# Patient Record
Sex: Female | Born: 1955 | Race: White | Hispanic: No | Marital: Married | Smoking: Never smoker
Health system: Southern US, Community
[De-identification: ages and names within clinical notes are randomized; demographics above are authoritative.]

## PROBLEM LIST (undated history)

## (undated) DIAGNOSIS — O039 Complete or unspecified spontaneous abortion without complication: Secondary | ICD-10-CM

## (undated) DIAGNOSIS — Z87442 Personal history of urinary calculi: Secondary | ICD-10-CM

## (undated) DIAGNOSIS — I493 Ventricular premature depolarization: Secondary | ICD-10-CM

## (undated) HISTORY — PX: WISDOM TOOTH EXTRACTION: SHX21

## (undated) HISTORY — PX: MYOMECTOMY: SHX85

## (undated) HISTORY — PX: OTHER SURGICAL HISTORY: SHX169

## (undated) HISTORY — PX: DILATION AND CURETTAGE OF UTERUS: SHX78

---

## 1998-08-15 ENCOUNTER — Other Ambulatory Visit: Admission: RE | Admit: 1998-08-15 | Discharge: 1998-08-15 | Payer: Self-pay | Admitting: Obstetrics and Gynecology

## 1998-11-07 ENCOUNTER — Ambulatory Visit (HOSPITAL_COMMUNITY): Admission: RE | Admit: 1998-11-07 | Discharge: 1998-11-07 | Payer: Self-pay | Admitting: Obstetrics and Gynecology

## 1999-08-07 ENCOUNTER — Encounter: Payer: Self-pay | Admitting: Obstetrics and Gynecology

## 1999-08-07 ENCOUNTER — Encounter: Admission: RE | Admit: 1999-08-07 | Discharge: 1999-08-07 | Payer: Self-pay | Admitting: Obstetrics and Gynecology

## 1999-08-08 ENCOUNTER — Other Ambulatory Visit: Admission: RE | Admit: 1999-08-08 | Discharge: 1999-08-08 | Payer: Self-pay | Admitting: Obstetrics and Gynecology

## 1999-08-21 ENCOUNTER — Encounter: Admission: RE | Admit: 1999-08-21 | Discharge: 1999-11-19 | Payer: Self-pay | Admitting: Family Medicine

## 1999-11-24 ENCOUNTER — Ambulatory Visit (HOSPITAL_COMMUNITY): Admission: RE | Admit: 1999-11-24 | Discharge: 1999-11-24 | Payer: Self-pay | Admitting: Obstetrics and Gynecology

## 1999-11-24 ENCOUNTER — Encounter (INDEPENDENT_AMBULATORY_CARE_PROVIDER_SITE_OTHER): Payer: Self-pay

## 2000-08-06 ENCOUNTER — Other Ambulatory Visit: Admission: RE | Admit: 2000-08-06 | Discharge: 2000-08-06 | Payer: Self-pay | Admitting: Obstetrics and Gynecology

## 2000-08-08 ENCOUNTER — Encounter: Admission: RE | Admit: 2000-08-08 | Discharge: 2000-08-08 | Payer: Self-pay | Admitting: Obstetrics and Gynecology

## 2000-08-08 ENCOUNTER — Encounter: Payer: Self-pay | Admitting: Obstetrics and Gynecology

## 2000-12-02 ENCOUNTER — Encounter: Payer: Self-pay | Admitting: Family Medicine

## 2000-12-02 ENCOUNTER — Encounter: Admission: RE | Admit: 2000-12-02 | Discharge: 2000-12-02 | Payer: Self-pay | Admitting: Family Medicine

## 2001-03-11 ENCOUNTER — Encounter: Admission: RE | Admit: 2001-03-11 | Discharge: 2001-03-11 | Payer: Self-pay | Admitting: Obstetrics and Gynecology

## 2001-03-11 ENCOUNTER — Encounter: Payer: Self-pay | Admitting: Obstetrics and Gynecology

## 2001-08-11 ENCOUNTER — Encounter: Payer: Self-pay | Admitting: Obstetrics and Gynecology

## 2001-08-11 ENCOUNTER — Other Ambulatory Visit: Admission: RE | Admit: 2001-08-11 | Discharge: 2001-08-11 | Payer: Self-pay | Admitting: Obstetrics and Gynecology

## 2001-08-11 ENCOUNTER — Encounter: Admission: RE | Admit: 2001-08-11 | Discharge: 2001-08-11 | Payer: Self-pay | Admitting: Obstetrics and Gynecology

## 2002-08-13 ENCOUNTER — Encounter: Payer: Self-pay | Admitting: Obstetrics and Gynecology

## 2002-08-13 ENCOUNTER — Encounter: Admission: RE | Admit: 2002-08-13 | Discharge: 2002-08-13 | Payer: Self-pay | Admitting: Obstetrics and Gynecology

## 2002-08-24 ENCOUNTER — Other Ambulatory Visit: Admission: RE | Admit: 2002-08-24 | Discharge: 2002-08-24 | Payer: Self-pay | Admitting: Obstetrics and Gynecology

## 2003-08-16 ENCOUNTER — Encounter: Admission: RE | Admit: 2003-08-16 | Discharge: 2003-08-16 | Payer: Self-pay | Admitting: Obstetrics and Gynecology

## 2003-08-30 ENCOUNTER — Other Ambulatory Visit: Admission: RE | Admit: 2003-08-30 | Discharge: 2003-08-30 | Payer: Self-pay | Admitting: Obstetrics and Gynecology

## 2004-07-03 ENCOUNTER — Encounter: Admission: RE | Admit: 2004-07-03 | Discharge: 2004-07-03 | Payer: Self-pay | Admitting: Obstetrics and Gynecology

## 2005-06-04 ENCOUNTER — Encounter: Admission: RE | Admit: 2005-06-04 | Discharge: 2005-06-04 | Payer: Self-pay | Admitting: Family Medicine

## 2005-07-18 ENCOUNTER — Encounter: Admission: RE | Admit: 2005-07-18 | Discharge: 2005-07-18 | Payer: Self-pay | Admitting: Obstetrics and Gynecology

## 2006-07-29 ENCOUNTER — Encounter: Admission: RE | Admit: 2006-07-29 | Discharge: 2006-07-29 | Payer: Self-pay | Admitting: Family Medicine

## 2006-09-05 ENCOUNTER — Other Ambulatory Visit: Admission: RE | Admit: 2006-09-05 | Discharge: 2006-09-05 | Payer: Self-pay | Admitting: Obstetrics and Gynecology

## 2007-08-04 ENCOUNTER — Encounter: Admission: RE | Admit: 2007-08-04 | Discharge: 2007-08-04 | Payer: Self-pay | Admitting: Obstetrics and Gynecology

## 2007-08-05 ENCOUNTER — Encounter: Admission: RE | Admit: 2007-08-05 | Discharge: 2007-08-05 | Payer: Self-pay | Admitting: Obstetrics and Gynecology

## 2007-09-08 ENCOUNTER — Other Ambulatory Visit: Admission: RE | Admit: 2007-09-08 | Discharge: 2007-09-22 | Payer: Self-pay | Admitting: Obstetrics and Gynecology

## 2008-07-28 ENCOUNTER — Encounter: Admission: RE | Admit: 2008-07-28 | Discharge: 2008-07-28 | Payer: Self-pay | Admitting: Obstetrics and Gynecology

## 2009-08-03 ENCOUNTER — Encounter: Admission: RE | Admit: 2009-08-03 | Discharge: 2009-08-03 | Payer: Self-pay | Admitting: Family Medicine

## 2009-08-30 ENCOUNTER — Other Ambulatory Visit: Admission: RE | Admit: 2009-08-30 | Discharge: 2009-08-30 | Payer: Self-pay | Admitting: Obstetrics and Gynecology

## 2010-07-08 ENCOUNTER — Other Ambulatory Visit: Payer: Self-pay | Admitting: Family Medicine

## 2010-07-08 DIAGNOSIS — Z1239 Encounter for other screening for malignant neoplasm of breast: Secondary | ICD-10-CM

## 2010-08-08 ENCOUNTER — Other Ambulatory Visit: Payer: Self-pay | Admitting: Obstetrics and Gynecology

## 2010-08-08 ENCOUNTER — Ambulatory Visit
Admission: RE | Admit: 2010-08-08 | Discharge: 2010-08-08 | Disposition: A | Discharge status: Home / Self Care | Payer: BC Managed Care – PPO | Source: Ambulatory Visit | Attending: Family Medicine | Admitting: Family Medicine

## 2010-08-08 ENCOUNTER — Other Ambulatory Visit (HOSPITAL_COMMUNITY)
Admission: RE | Admit: 2010-08-08 | Discharge: 2010-08-08 | Disposition: A | Discharge status: Home / Self Care | Payer: BC Managed Care – PPO | Source: Ambulatory Visit | Attending: Obstetrics & Gynecology | Admitting: Obstetrics & Gynecology

## 2010-08-08 DIAGNOSIS — Z01419 Encounter for gynecological examination (general) (routine) without abnormal findings: Secondary | ICD-10-CM | POA: Insufficient documentation

## 2010-08-08 DIAGNOSIS — Z1239 Encounter for other screening for malignant neoplasm of breast: Secondary | ICD-10-CM

## 2010-11-03 NOTE — Op Note (Signed)
[facility]  [name], Sarah Schultz                          MRN: [id number] Proc. Date: 11/24/99 Adm. Date:  [id number] Disc. Date: [id number] Attending:  Morene Antu                           Operative Report  PREOPERATIVE DIAGNOSIS:       Fibroid uterus, menorrhagia.  POSTOPERATIVE DIAGNOSIS:      Submucosal fibroid.  PROCEDURE:                    D&C hysteroscopy with resectoscope.  SURGEON:                      Sherry A. Rosalio Macadamia, M.D.  ANESTHESIA:                   MAC.  INDICATIONS:                  This is a 55 year old G1 P0-0-1-0 woman who has a known fibroid uterus.  Patient has had a previous D&C hysteroscopy for resection of fibroid uterus and fibroids in May 2000.  Patient initially had decreased bleeding.  However, the bleeding has increased significantly over the last few months again.  Because of the increased bleeding, the patient was seen in the office and an ultrasound was performed.  Ultrasound revealed multiple fibroids with a possibility of a repeat submucosal fibroid.  Because of this, a D&C hysteroscopy with resectoscope was performed.  FINDINGS:                     Normal size anteflexed uterus, slightly irregular, no adnexal mass.  Submucosal fibroid present approximately 1-2 cm.  PROCEDURE:                    Patient is brought into the operating room and given adequate IV sedation.  She is placed in the dorsolithotomy position. Her perineum was washed with Betadine.  Pelvic examination was performed.  The surgeons gown and gloves were changed.  Patient was draped in a sterile fashion.  Speculum was placed within the vagina.  The vagina was washed with Betadine.  A paracervical block was administered with 1% Nesacaine.  Anterior lip of the cervix was grasped with a single tooth tenaculum.  Cervix was sounded.  Cervix was dilated with Pratt dilators to a #31.  The hysteroscope was easily introduced into the  endometrial cavity and pictures were obtained. A slight depression of the endometrial cavity was seen just to the right of the submucosal fibroid.  This was carefully inspected and felt not to be a perforation.  Both cornua were visualized and pictures were obtained.  Using a double-loop right-angle resector, the submucosal fibroid was resected off in sheets.  Once this was felt to be completed, the endometrial cavity was revisualized.  The ______ differential had been monitored very carefully during the procedure.  ______ differential just prior to the last removal of the fibroid was -90, and immediately after was -160.  Evaluation revealed that there was a possibility of a small perforation.  Because of this, the procedure was terminated at this time.  The final differential was measured at -120, having collected the fluid beneath the patients buttocks, as well as the extra fluid  in the bag that had not been measured prior to this time.  The speculum was replaced in the vagina after removing the tenaculum.  There was adequate hemostasis.  There was no vaginal bleeding present and no fluid coming from the cervix.  The patient was then taken out of the dorsolithotomy position.  She was awakened.  She was moved from the operating table to a stretcher in stable condition.  COMPLICATIONS:                Question of a uterine perforation.  CONDITION:                    Stable.  All information was given to the patient immediately postoperatively.  ESTIMATED BLOOD LOSS:         Less than 5 cc.   ______ differential -120. DD:  11/24/99 TD:  11/27/99 Job: [id number]

## 2011-07-02 ENCOUNTER — Other Ambulatory Visit: Payer: Self-pay | Admitting: Family Medicine

## 2011-07-02 DIAGNOSIS — Z1231 Encounter for screening mammogram for malignant neoplasm of breast: Secondary | ICD-10-CM

## 2011-08-10 ENCOUNTER — Ambulatory Visit
Admission: RE | Admit: 2011-08-10 | Discharge: 2011-08-10 | Disposition: A | Discharge status: Home / Self Care | Payer: BC Managed Care – PPO | Source: Ambulatory Visit | Attending: Family Medicine | Admitting: Family Medicine

## 2011-08-10 DIAGNOSIS — Z1231 Encounter for screening mammogram for malignant neoplasm of breast: Secondary | ICD-10-CM

## 2011-08-17 ENCOUNTER — Other Ambulatory Visit (HOSPITAL_COMMUNITY)
Admission: RE | Admit: 2011-08-17 | Discharge: 2011-08-17 | Disposition: A | Discharge status: Home / Self Care | Payer: BC Managed Care – PPO | Source: Ambulatory Visit | Attending: Obstetrics and Gynecology | Admitting: Obstetrics and Gynecology

## 2011-08-17 ENCOUNTER — Other Ambulatory Visit: Payer: Self-pay | Admitting: Obstetrics and Gynecology

## 2011-08-17 DIAGNOSIS — Z01419 Encounter for gynecological examination (general) (routine) without abnormal findings: Secondary | ICD-10-CM | POA: Insufficient documentation

## 2012-07-17 ENCOUNTER — Other Ambulatory Visit: Payer: Self-pay | Admitting: Family Medicine

## 2012-07-17 DIAGNOSIS — Z1231 Encounter for screening mammogram for malignant neoplasm of breast: Secondary | ICD-10-CM

## 2012-08-11 ENCOUNTER — Ambulatory Visit
Admission: RE | Admit: 2012-08-11 | Discharge: 2012-08-11 | Disposition: A | Discharge status: Home / Self Care | Payer: BC Managed Care – PPO | Source: Ambulatory Visit | Attending: Family Medicine | Admitting: Family Medicine

## 2012-08-11 DIAGNOSIS — Z1231 Encounter for screening mammogram for malignant neoplasm of breast: Secondary | ICD-10-CM

## 2013-07-10 ENCOUNTER — Other Ambulatory Visit: Payer: Self-pay

## 2013-07-10 DIAGNOSIS — Z1231 Encounter for screening mammogram for malignant neoplasm of breast: Secondary | ICD-10-CM

## 2013-08-18 ENCOUNTER — Ambulatory Visit
Admission: RE | Admit: 2013-08-18 | Discharge: 2013-08-18 | Disposition: A | Discharge status: Home / Self Care | Payer: Self-pay | Source: Ambulatory Visit

## 2013-08-18 DIAGNOSIS — Z1231 Encounter for screening mammogram for malignant neoplasm of breast: Secondary | ICD-10-CM

## 2014-07-29 ENCOUNTER — Other Ambulatory Visit: Payer: Self-pay

## 2014-07-29 DIAGNOSIS — Z1231 Encounter for screening mammogram for malignant neoplasm of breast: Secondary | ICD-10-CM

## 2014-08-23 ENCOUNTER — Ambulatory Visit
Admission: RE | Admit: 2014-08-23 | Discharge: 2014-08-23 | Disposition: A | Discharge status: Home / Self Care | Payer: BLUE CROSS/BLUE SHIELD | Source: Ambulatory Visit

## 2014-08-23 DIAGNOSIS — Z1231 Encounter for screening mammogram for malignant neoplasm of breast: Secondary | ICD-10-CM

## 2015-07-22 ENCOUNTER — Other Ambulatory Visit: Payer: Self-pay

## 2015-07-22 DIAGNOSIS — Z1231 Encounter for screening mammogram for malignant neoplasm of breast: Secondary | ICD-10-CM

## 2015-08-26 ENCOUNTER — Ambulatory Visit
Admission: RE | Admit: 2015-08-26 | Discharge: 2015-08-26 | Disposition: A | Discharge status: Home / Self Care | Payer: BLUE CROSS/BLUE SHIELD | Source: Ambulatory Visit

## 2015-08-26 DIAGNOSIS — Z1231 Encounter for screening mammogram for malignant neoplasm of breast: Secondary | ICD-10-CM

## 2015-10-28 DIAGNOSIS — J069 Acute upper respiratory infection, unspecified: Secondary | ICD-10-CM | POA: Diagnosis not present

## 2016-02-21 DIAGNOSIS — Z23 Encounter for immunization: Secondary | ICD-10-CM | POA: Diagnosis not present

## 2016-05-30 DIAGNOSIS — R002 Palpitations: Secondary | ICD-10-CM | POA: Diagnosis not present

## 2016-05-30 DIAGNOSIS — E7800 Pure hypercholesterolemia, unspecified: Secondary | ICD-10-CM | POA: Diagnosis not present

## 2016-05-30 DIAGNOSIS — Z0000 Encounter for general adult medical examination without abnormal findings: Secondary | ICD-10-CM | POA: Diagnosis not present

## 2016-05-30 DIAGNOSIS — N951 Menopausal and female climacteric states: Secondary | ICD-10-CM | POA: Diagnosis not present

## 2016-06-05 DIAGNOSIS — R52 Pain, unspecified: Secondary | ICD-10-CM | POA: Diagnosis not present

## 2016-06-05 DIAGNOSIS — R05 Cough: Secondary | ICD-10-CM | POA: Diagnosis not present

## 2016-06-05 DIAGNOSIS — J22 Unspecified acute lower respiratory infection: Secondary | ICD-10-CM | POA: Diagnosis not present

## 2016-06-20 DIAGNOSIS — H5213 Myopia, bilateral: Secondary | ICD-10-CM | POA: Diagnosis not present

## 2016-06-20 DIAGNOSIS — H25813 Combined forms of age-related cataract, bilateral: Secondary | ICD-10-CM | POA: Diagnosis not present

## 2016-07-16 ENCOUNTER — Other Ambulatory Visit: Payer: Self-pay | Admitting: Family Medicine

## 2016-07-16 DIAGNOSIS — Z1231 Encounter for screening mammogram for malignant neoplasm of breast: Secondary | ICD-10-CM

## 2016-08-27 ENCOUNTER — Ambulatory Visit
Admission: RE | Admit: 2016-08-27 | Discharge: 2016-08-27 | Disposition: A | Discharge status: Home / Self Care | Payer: BLUE CROSS/BLUE SHIELD | Source: Ambulatory Visit | Attending: Family Medicine | Admitting: Family Medicine

## 2016-08-27 DIAGNOSIS — Z1231 Encounter for screening mammogram for malignant neoplasm of breast: Secondary | ICD-10-CM | POA: Diagnosis not present

## 2016-09-18 DIAGNOSIS — Z01419 Encounter for gynecological examination (general) (routine) without abnormal findings: Secondary | ICD-10-CM | POA: Diagnosis not present

## 2016-09-18 DIAGNOSIS — Z6829 Body mass index (BMI) 29.0-29.9, adult: Secondary | ICD-10-CM | POA: Diagnosis not present

## 2016-12-05 DIAGNOSIS — I493 Ventricular premature depolarization: Secondary | ICD-10-CM | POA: Diagnosis not present

## 2016-12-05 DIAGNOSIS — L259 Unspecified contact dermatitis, unspecified cause: Secondary | ICD-10-CM | POA: Diagnosis not present

## 2016-12-05 DIAGNOSIS — E7800 Pure hypercholesterolemia, unspecified: Secondary | ICD-10-CM | POA: Diagnosis not present

## 2017-07-01 DIAGNOSIS — Z0000 Encounter for general adult medical examination without abnormal findings: Secondary | ICD-10-CM | POA: Diagnosis not present

## 2017-07-01 DIAGNOSIS — E7800 Pure hypercholesterolemia, unspecified: Secondary | ICD-10-CM | POA: Diagnosis not present

## 2017-07-04 DIAGNOSIS — M25519 Pain in unspecified shoulder: Secondary | ICD-10-CM | POA: Diagnosis not present

## 2017-07-08 DIAGNOSIS — M25519 Pain in unspecified shoulder: Secondary | ICD-10-CM | POA: Diagnosis not present

## 2017-07-11 DIAGNOSIS — M25519 Pain in unspecified shoulder: Secondary | ICD-10-CM | POA: Diagnosis not present

## 2017-07-15 ENCOUNTER — Other Ambulatory Visit: Payer: Self-pay | Admitting: Family Medicine

## 2017-07-15 DIAGNOSIS — M25519 Pain in unspecified shoulder: Secondary | ICD-10-CM | POA: Diagnosis not present

## 2017-07-15 DIAGNOSIS — Z139 Encounter for screening, unspecified: Secondary | ICD-10-CM

## 2017-07-18 DIAGNOSIS — M25519 Pain in unspecified shoulder: Secondary | ICD-10-CM | POA: Diagnosis not present

## 2017-07-18 DIAGNOSIS — Z1211 Encounter for screening for malignant neoplasm of colon: Secondary | ICD-10-CM | POA: Diagnosis not present

## 2017-07-22 DIAGNOSIS — M25519 Pain in unspecified shoulder: Secondary | ICD-10-CM | POA: Diagnosis not present

## 2017-07-25 DIAGNOSIS — M25519 Pain in unspecified shoulder: Secondary | ICD-10-CM | POA: Diagnosis not present

## 2017-07-29 DIAGNOSIS — M25519 Pain in unspecified shoulder: Secondary | ICD-10-CM | POA: Diagnosis not present

## 2017-08-01 DIAGNOSIS — M25519 Pain in unspecified shoulder: Secondary | ICD-10-CM | POA: Diagnosis not present

## 2017-08-05 DIAGNOSIS — H04122 Dry eye syndrome of left lacrimal gland: Secondary | ICD-10-CM | POA: Diagnosis not present

## 2017-08-05 DIAGNOSIS — M25519 Pain in unspecified shoulder: Secondary | ICD-10-CM | POA: Diagnosis not present

## 2017-08-08 DIAGNOSIS — M25519 Pain in unspecified shoulder: Secondary | ICD-10-CM | POA: Diagnosis not present

## 2017-08-12 DIAGNOSIS — M25519 Pain in unspecified shoulder: Secondary | ICD-10-CM | POA: Diagnosis not present

## 2017-08-14 DIAGNOSIS — M25519 Pain in unspecified shoulder: Secondary | ICD-10-CM | POA: Diagnosis not present

## 2017-08-19 DIAGNOSIS — M25519 Pain in unspecified shoulder: Secondary | ICD-10-CM | POA: Diagnosis not present

## 2017-08-26 DIAGNOSIS — M25519 Pain in unspecified shoulder: Secondary | ICD-10-CM | POA: Diagnosis not present

## 2017-08-28 ENCOUNTER — Encounter: Payer: Self-pay | Admitting: Radiology

## 2017-08-28 ENCOUNTER — Ambulatory Visit
Admission: RE | Admit: 2017-08-28 | Discharge: 2017-08-28 | Disposition: A | Discharge status: Home / Self Care | Payer: BLUE CROSS/BLUE SHIELD | Source: Ambulatory Visit | Attending: Family Medicine | Admitting: Family Medicine

## 2017-08-28 DIAGNOSIS — Z1231 Encounter for screening mammogram for malignant neoplasm of breast: Secondary | ICD-10-CM | POA: Diagnosis not present

## 2017-08-28 DIAGNOSIS — Z139 Encounter for screening, unspecified: Secondary | ICD-10-CM

## 2017-09-02 DIAGNOSIS — M25519 Pain in unspecified shoulder: Secondary | ICD-10-CM | POA: Diagnosis not present

## 2017-09-11 DIAGNOSIS — D125 Benign neoplasm of sigmoid colon: Secondary | ICD-10-CM | POA: Diagnosis not present

## 2017-09-11 DIAGNOSIS — Z1211 Encounter for screening for malignant neoplasm of colon: Secondary | ICD-10-CM | POA: Diagnosis not present

## 2017-09-11 DIAGNOSIS — K635 Polyp of colon: Secondary | ICD-10-CM | POA: Diagnosis not present

## 2017-09-11 DIAGNOSIS — D122 Benign neoplasm of ascending colon: Secondary | ICD-10-CM | POA: Diagnosis not present

## 2017-09-20 DIAGNOSIS — Z6829 Body mass index (BMI) 29.0-29.9, adult: Secondary | ICD-10-CM | POA: Diagnosis not present

## 2017-09-20 DIAGNOSIS — Z1151 Encounter for screening for human papillomavirus (HPV): Secondary | ICD-10-CM | POA: Diagnosis not present

## 2017-09-20 DIAGNOSIS — Z01419 Encounter for gynecological examination (general) (routine) without abnormal findings: Secondary | ICD-10-CM | POA: Diagnosis not present

## 2017-12-09 DIAGNOSIS — E7800 Pure hypercholesterolemia, unspecified: Secondary | ICD-10-CM | POA: Diagnosis not present

## 2018-06-03 ENCOUNTER — Emergency Department (HOSPITAL_COMMUNITY)
Admission: EM | Admit: 2018-06-03 | Discharge: 2018-06-03 | Disposition: A | Discharge status: Home / Self Care | Payer: BLUE CROSS/BLUE SHIELD | Attending: Emergency Medicine | Admitting: Emergency Medicine

## 2018-06-03 ENCOUNTER — Emergency Department (HOSPITAL_COMMUNITY): Payer: BLUE CROSS/BLUE SHIELD

## 2018-06-03 ENCOUNTER — Other Ambulatory Visit: Payer: Self-pay

## 2018-06-03 ENCOUNTER — Encounter (HOSPITAL_COMMUNITY): Payer: Self-pay | Admitting: Emergency Medicine

## 2018-06-03 DIAGNOSIS — N200 Calculus of kidney: Secondary | ICD-10-CM | POA: Insufficient documentation

## 2018-06-03 DIAGNOSIS — R1031 Right lower quadrant pain: Secondary | ICD-10-CM | POA: Diagnosis not present

## 2018-06-03 DIAGNOSIS — N132 Hydronephrosis with renal and ureteral calculous obstruction: Secondary | ICD-10-CM | POA: Diagnosis not present

## 2018-06-03 DIAGNOSIS — Z79899 Other long term (current) drug therapy: Secondary | ICD-10-CM | POA: Insufficient documentation

## 2018-06-03 DIAGNOSIS — Z7982 Long term (current) use of aspirin: Secondary | ICD-10-CM | POA: Insufficient documentation

## 2018-06-03 LAB — URINALYSIS, ROUTINE W REFLEX MICROSCOPIC
BILIRUBIN URINE: NEGATIVE
Glucose, UA: NEGATIVE mg/dL
Ketones, ur: 20 mg/dL — AB
Leukocytes, UA: NEGATIVE
Nitrite: NEGATIVE
Protein, ur: NEGATIVE mg/dL
Specific Gravity, Urine: 1.004 — ABNORMAL LOW (ref 1.005–1.030)
pH: 6 (ref 5.0–8.0)

## 2018-06-03 LAB — COMPREHENSIVE METABOLIC PANEL
ALT: 37 U/L (ref 0–44)
AST: 38 U/L (ref 15–41)
Albumin: 4.4 g/dL (ref 3.5–5.0)
Alkaline Phosphatase: 67 U/L (ref 38–126)
Anion gap: 9 (ref 5–15)
BUN: 11 mg/dL (ref 6–20)
CALCIUM: 9.5 mg/dL (ref 8.9–10.3)
CO2: 25 mmol/L (ref 22–32)
Chloride: 107 mmol/L (ref 98–111)
Creatinine, Ser: 0.9 mg/dL (ref 0.44–1.00)
GFR calc Af Amer: >60 mL/min (ref >60)
GFR calc non Af Amer: >60 mL/min (ref >60)
Glucose, Bld: 106 mg/dL — ABNORMAL HIGH (ref 70–99)
Potassium: 4.2 mmol/L (ref 3.5–5.1)
Sodium: 141 mmol/L (ref 135–145)
Total Bilirubin: 0.6 mg/dL (ref 0.3–1.2)
Total Protein: 7.5 g/dL (ref 6.5–8.1)

## 2018-06-03 LAB — CBC
HCT: 43.7 % (ref 36.0–46.0)
Hemoglobin: 14.3 g/dL (ref 12.0–15.0)
MCH: 30.8 pg (ref 26.0–34.0)
MCHC: 32.7 g/dL (ref 30.0–36.0)
MCV: 94.2 fL (ref 80.0–100.0)
PLATELETS: 306 10*3/uL (ref 150–400)
RBC: 4.64 MIL/uL (ref 3.87–5.11)
RDW: 12.6 % (ref 11.5–15.5)
WBC: 7.7 10*3/uL (ref 4.0–10.5)
nRBC: 0 % (ref 0.0–0.2)

## 2018-06-03 LAB — LIPASE, BLOOD: Lipase: 25 U/L (ref 11–51)

## 2018-06-03 MED ORDER — TAMSULOSIN HCL 0.4 MG PO CAPS: 0.4000 mg | ORAL_CAPSULE | Freq: Every day | ORAL | 0 refills | Status: DC

## 2018-06-03 MED ORDER — IOPAMIDOL (ISOVUE-300) INJECTION 61%
100.0000 mL | Freq: Once | INTRAVENOUS | Status: AC | PRN
  Administered 2018-06-03: 100 mL via INTRAVENOUS

## 2018-06-03 MED ORDER — SODIUM CHLORIDE (PF) 0.9 % IJ SOLN
INTRAMUSCULAR | Status: AC
  Filled 2018-06-03: qty 50

## 2018-06-03 MED ORDER — ONDANSETRON 4 MG PO TBDP: 4.0000 mg | ORAL_TABLET | Freq: Three times a day (TID) | ORAL | 0 refills | Status: DC | PRN

## 2018-06-03 MED ORDER — IOPAMIDOL (ISOVUE-300) INJECTION 61%
INTRAVENOUS | Status: AC
  Filled 2018-06-03: qty 100

## 2018-06-03 NOTE — ED Provider Notes (Signed)
[facility]-EMERGENCY DEPT Provider Note   CSN: [id number] Arrival date & time: 06/03/18  1404     History   Chief Complaint Chief Complaint  Patient presents with  . Abdominal Pain    HPI Sarah Schultz is a 62 y.o. female is here for evaluation of right lower abdominal pain.  Onset Saturday.  Initially it was mild, dull, intermittent.  The pain acutely worsened yesterday became more sharp.  Associated with nausea and decreased appetite.  Currently she feels like her pain has significantly improved.  There was some radiation into the right flank.  Patient is a retired Development worker, community.  She denies abdominal surgeries.  She had a recent colonoscopy that showed some hyperplastic polyps and adenoma but no malignancy and otherwise normal.  She has history of uterine leiomyomas that have been removed, she still has a small leiomyomas.  She denies any fevers, chills, vomiting, diarrhea, constipation, hematuria, dysuria, urinary frequency, abnormal vaginal bleeding or discharge.  No rash.  No history of kidney stones.  No alleviating or aggravating factors.  No trauma.   HPI  History reviewed. No pertinent past medical history.  There are no active problems to display for this patient.   History reviewed. No pertinent surgical history.   OB History   No obstetric history on file.      Home Medications    Prior to Admission medications   Medication Sig Start Date End Date Taking? Authorizing Provider  aspirin EC 81 MG tablet Take 81 mg by mouth every other day. Sun, Mon, Wed, Fri   Yes [provider]  atorvastatin (LIPITOR) 20 MG tablet Take 20 mg by mouth daily.   Yes [provider]  estradiol (ESTRACE) 1 MG tablet Take 1 mg by mouth daily.   Yes [provider]  norethindrone (AYGESTIN) 5 MG tablet Take 2.5 mg by mouth every other day. Sun, Mon, Wed, Fri   Yes [provider]  ondansetron (ZOFRAN ODT) 4 MG disintegrating tablet  Take 1 tablet (4 mg total) by mouth every 8 (eight) hours as needed for nausea or vomiting. 06/03/18   Liberty Handy, PA-C  tamsulosin (FLOMAX) 0.4 MG CAPS capsule Take 1 capsule (0.4 mg total) by mouth daily. 06/03/18   Liberty Handy, PA-C    Family History Family History  Problem Relation Age of Onset  . Breast cancer Cousin 106    Social History Social History   Tobacco Use  . Smoking status: Not on file  Substance Use Topics  . Alcohol use: Not on file  . Drug use: Not on file     Allergies   Patient has no known allergies.   Review of Systems Review of Systems  Gastrointestinal: Positive for abdominal pain and nausea.  Genitourinary: Positive for flank pain.  All other systems reviewed and are negative.    Physical Exam Updated Vital Signs BP (!) 135/96   Pulse 88   Temp 97.7 F (36.5 C) (Oral)   Resp 18   Ht 5' 5.5" (1.664 m)   Wt 82.3 kg   SpO2 98%   BMI 29.73 kg/m   Physical Exam Vitals signs and nursing note reviewed.  Constitutional:      Appearance: She is well-developed.     Comments: Non toxic  HENT:     Head: Normocephalic and atraumatic.     Nose: Nose normal.  Eyes:     Conjunctiva/sclera: Conjunctivae normal.     Pupils: Pupils are equal,  round, and reactive to light.  Neck:     Musculoskeletal: Normal range of motion.  Cardiovascular:     Rate and Rhythm: Normal rate and regular rhythm.  Pulmonary:     Effort: Pulmonary effort is normal.     Breath sounds: Normal breath sounds.  Abdominal:     General: Bowel sounds are normal.     Palpations: Abdomen is soft.     Tenderness: There is no abdominal tenderness.     Comments: Mild right CVAT. No G/R/R. No suprapubic tenderness. Negative Murphy's and McBurney's. Active BS to lower quadrants.   Musculoskeletal: Normal range of motion.  Skin:    General: Skin is warm and dry.     Capillary Refill: Capillary refill takes less than 2 seconds.     Comments: No rash to  flank/abdomen  Neurological:     Mental Status: She is alert and oriented to person, place, and time.  Psychiatric:        Behavior: Behavior normal.      ED Treatments / Results  Labs (all labs ordered are listed, but only abnormal results are displayed) Labs Reviewed  COMPREHENSIVE METABOLIC PANEL - Abnormal; Notable for the following components:      Result Value   Glucose, Bld 106 (*)    All other components within normal limits  URINALYSIS, ROUTINE W REFLEX MICROSCOPIC - Abnormal; Notable for the following components:   Color, Urine STRAW (*)    Specific Gravity, Urine 1.004 (*)    Hgb urine dipstick SMALL (*)    Ketones, ur 20 (*)    Bacteria, UA RARE (*)    All other components within normal limits  URINE CULTURE  LIPASE, BLOOD  CBC    EKG None  Radiology Ct Abdomen Pelvis W Contrast  Result Date: 06/03/2018 CLINICAL DATA:  Improving right lower quadrant abdominal pain. Nausea. EXAM: CT ABDOMEN AND PELVIS WITH CONTRAST TECHNIQUE: Multidetector CT imaging of the abdomen and pelvis was performed using the standard protocol following bolus administration of intravenous contrast. CONTRAST:  ISOVUE-300 IOPAMIDOL (ISOVUE-300) INJECTION 61% COMPARISON:  None. FINDINGS: Lower chest: The lung bases are clear. Hepatobiliary: 13 mm subcapsular lesion in the right hepatic lobe has peripheral puddling contrast and is consistent with hemangioma. No suspicious hepatic lesion. Gallbladder Pancreas: No ductal dilatation or inflammation. Spleen: Normal in size without focal abnormality. Adrenals/Urinary Tract: Normal adrenal glands. 4 mm stone at the right ureterovesicular junction is likely partially obstructing with only minimal hydroureteronephrosis. There is slightly diminished right renal excretion on delayed phase imaging. Mild proximal ureteral enhancement and periureteric and renal stranding. No left hydronephrosis. No evidence of nonobstructing nephrolithiasis in either  kidney, allowing small stones could be obscured by contrast. Urinary bladder is physiologically distended. No bladder wall thickening Stomach/Bowel: Stomach is within normal limits. Appendix is normal, for example image 58 series 2. No evidence of bowel wall thickening, distention, or inflammatory changes. Vascular/Lymphatic: No significant vascular findings are present. No enlarged abdominal or pelvic lymph nodes. Reproductive: Uterus is slightly prominent and heterogeneous, question underlying fibroids. No adnexal mass. Other: Trace fluid in the pelvis is likely reactive. No ascites. No free air. Tiny fat containing umbilical hernia. Musculoskeletal: There are no acute or suspicious osseous abnormalities. IMPRESSION: 1. Partially obstructing 4 mm stone at the right ureterovesicular junction with mild right hydroureteronephrosis. 2. Normal appendix. 3. Incidental small hepatic hemangioma. Electronically Signed   By: Narda Rutherford M.D.   On: 06/03/2018 22:55    Procedures Procedures (including critical  care time)  Medications Ordered in ED Medications  iopamidol (ISOVUE-300) 61 % injection (has no administration in time range)  sodium chloride (PF) 0.9 % injection (has no administration in time range)  iopamidol (ISOVUE-300) 61 % injection 100 mL (100 mLs Intravenous Contrast Given 06/03/18 2234)     Initial Impression / Assessment and Plan / ED Course  I have reviewed the triage vital signs and the nursing notes.  Pertinent labs & imaging results that were available during my care of the patient were reviewed by me and considered in my medical decision making (see chart for details).  Clinical Course as of Jun 04 29  Wed Jun 04, 2018  0019 Hgb urine dipstick(!): SMALL [CG]  0020 Ketones, ur(!): 20 [CG]  0020 Bacteria, UA(!): RARE [CG]  0020  IMPRESSION: 1. Partially obstructing 4 mm stone at the right ureterovesicular junction with mild right hydroureteronephrosis. 2. Normal  appendix. 3. Incidental small hepatic hemangioma.  CT ABDOMEN PELVIS W CONTRAST [CG]    Clinical Course User Index [CG] Liberty HandyGibbons, Audery Wassenaar J, PA-C    Highest on ddx includes renal stone vs pyelonephritis/UTI.  Given radiation into RLQ, also considering appendicitis. She has no abd surgeries, peritonitis on exam, fever, vomiting, constipation and SBO considered less likely. Negative murphy's.  I doubt dissection in this patient.    0028: Labs reassuring, no leukocytosis. Normal LFT and creatinine. UA without RBCs or infection.  CTAP shows 4 mm right stone with mild hydronephrosis.  Pt has minimal pain.  No emesis in ER. I do not see indication for further emergent lab work/imaging or admission. We will dc with flomax, zofran, high dose NSAID. She declined stronger pain medicines.  She is to f/u with urology for further evaluation as needed. Return precautions given. Pt and husband in agreement and comfortable with this plan. Discussed with Dr. Donnald GarrePfeiffer.  Final Clinical Impressions(s) / ED Diagnoses   Final diagnoses:  Right renal stone    ED Discharge Orders         Ordered    ondansetron (ZOFRAN ODT) 4 MG disintegrating tablet  Every 8 hours PRN     06/03/18 2319    tamsulosin (FLOMAX) 0.4 MG CAPS capsule  Daily     06/03/18 2319           Liberty HandyGibbons, Fariha Goto J, PA-C 06/04/18 0030    Arby BarrettePfeiffer, Marcy, MD 06/04/18 [phone]

## 2018-06-03 NOTE — Discharge Instructions (Signed)
You were seen in the ER for right abdominal pain.  CT of abdomen and pelvis showed a 4 mm stone at the right ureterovesicular junction with some swelling along the ureter.  No signs of urine infection.  We will discharge you with nausea medicine and Flomax.  Stay well-hydrated.  You declined pain medicines today, you can treat your pain with acetaminophen and ibuprofen.  Follow-up with urology.  Return to the ER for worsening pain, fevers, chills, vomiting, inability to urinate.

## 2018-06-03 NOTE — ED Triage Notes (Addendum)
Patient c/o lower abdominal pain progressing from cramps to pain radiating to right lower back since x3 days. Reports nausea, denies V/D. Denies urinary sx.

## 2018-06-05 LAB — URINE CULTURE: Culture: NO GROWTH

## 2018-06-10 ENCOUNTER — Other Ambulatory Visit: Payer: Self-pay | Admitting: Urology

## 2018-06-10 DIAGNOSIS — N201 Calculus of ureter: Secondary | ICD-10-CM | POA: Diagnosis not present

## 2018-06-10 DIAGNOSIS — N132 Hydronephrosis with renal and ureteral calculous obstruction: Secondary | ICD-10-CM | POA: Diagnosis not present

## 2018-06-13 ENCOUNTER — Encounter (HOSPITAL_COMMUNITY): Payer: Self-pay | Admitting: *Deleted

## 2018-06-19 ENCOUNTER — Encounter (HOSPITAL_COMMUNITY): Payer: Self-pay | Admitting: General Practice

## 2018-06-19 ENCOUNTER — Encounter (HOSPITAL_COMMUNITY)
Admission: RE | Disposition: A | Discharge status: Home / Self Care | Payer: Self-pay | Source: Home / Self Care | Attending: Urology

## 2018-06-19 ENCOUNTER — Ambulatory Visit (HOSPITAL_COMMUNITY): Payer: BLUE CROSS/BLUE SHIELD

## 2018-06-19 ENCOUNTER — Ambulatory Visit (HOSPITAL_COMMUNITY)
Admission: RE | Admit: 2018-06-19 | Discharge: 2018-06-19 | Disposition: A | Discharge status: Home / Self Care | Payer: BLUE CROSS/BLUE SHIELD | Attending: Urology | Admitting: Urology

## 2018-06-19 DIAGNOSIS — M1990 Unspecified osteoarthritis, unspecified site: Secondary | ICD-10-CM | POA: Insufficient documentation

## 2018-06-19 DIAGNOSIS — N135 Crossing vessel and stricture of ureter without hydronephrosis: Secondary | ICD-10-CM | POA: Insufficient documentation

## 2018-06-19 DIAGNOSIS — K219 Gastro-esophageal reflux disease without esophagitis: Secondary | ICD-10-CM | POA: Diagnosis not present

## 2018-06-19 DIAGNOSIS — Z79899 Other long term (current) drug therapy: Secondary | ICD-10-CM | POA: Diagnosis not present

## 2018-06-19 DIAGNOSIS — N201 Calculus of ureter: Secondary | ICD-10-CM

## 2018-06-19 DIAGNOSIS — E7800 Pure hypercholesterolemia, unspecified: Secondary | ICD-10-CM | POA: Insufficient documentation

## 2018-06-19 HISTORY — DX: Personal history of urinary calculi: Z87.442

## 2018-06-19 HISTORY — PX: EXTRACORPOREAL SHOCK WAVE LITHOTRIPSY: SHX1557

## 2018-06-19 HISTORY — DX: Complete or unspecified spontaneous abortion without complication: O03.9

## 2018-06-19 HISTORY — DX: Ventricular premature depolarization: I49.3

## 2018-06-19 SURGERY — LITHOTRIPSY, ESWL
Anesthesia: LOCAL | Laterality: Right

## 2018-06-19 MED ORDER — DIPHENHYDRAMINE HCL 25 MG PO CAPS
25.0000 mg | ORAL_CAPSULE | ORAL | Status: AC
  Administered 2018-06-19: 25 mg via ORAL
  Filled 2018-06-19: qty 1

## 2018-06-19 MED ORDER — DIAZEPAM 5 MG PO TABS
10.0000 mg | ORAL_TABLET | ORAL | Status: AC
  Administered 2018-06-19: 10 mg via ORAL
  Filled 2018-06-19: qty 2

## 2018-06-19 MED ORDER — SODIUM CHLORIDE 0.9 % IV SOLN
INTRAVENOUS | Status: DC
  Administered 2018-06-19: 07:00:00 via INTRAVENOUS

## 2018-06-19 MED ORDER — CIPROFLOXACIN HCL 500 MG PO TABS
500.0000 mg | ORAL_TABLET | ORAL | Status: AC
  Administered 2018-06-19: 500 mg via ORAL
  Filled 2018-06-19: qty 1

## 2018-06-19 NOTE — H&P (Signed)
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 She presented to the emergency department on 06/03/2018 with severe right-sided abdominal pain. CT scan revealed a distal right ureterovesicular junction calculus about 4 mm without hydronephrosis. She continues to have mild left-sided groin pain but it is not severe. Denies any fever, nausea, vomiting, dysuria. Urinalysis is negative today. KUB revealed persistence of the stone.     ALLERGIES: None   MEDICATIONS: Lipitor 20 mg tablet  Aspir 81  Calcium Carbonate  Estradiol  Norethindrone     GU PSH: None   NON-GU PSH: Knee Arthroscopy/surgery, Left    GU PMH: None   NON-GU PMH: Arrhythmia Arthritis GERD Hypercholesterolemia    FAMILY HISTORY: Acute CVA (cerebrovascular accident) - Mother Blood In Urine - Father Congestive Heart Failure - Mother Hypercholesterolemia - Brother Lung Cancer - Father   SOCIAL HISTORY: Marital Status: Married Preferred Language: English; Race: White Current Smoking Status: Patient has never smoked.   Tobacco Use Assessment Completed: Used Tobacco in last 30 days? Does not drink caffeine.    REVIEW OF SYSTEMS:    GU Review Female:   Patient reports frequent urination. Patient denies hard to postpone urination, burning /pain with urination, get up at night to urinate, leakage of urine, stream starts and stops, trouble starting your stream, have to strain to urinate, and being pregnant.  Gastrointestinal (Upper):   Patient reports nausea. Patient denies vomiting and indigestion/ heartburn.  Gastrointestinal (Lower):   Patient denies diarrhea and constipation.  Constitutional:   Patient denies fever, night sweats, weight loss, and fatigue.  Skin:   Patient denies skin rash/ lesion and itching.  Eyes:   Patient denies double vision and blurred vision.  Ears/ Nose/ Throat:   Patient denies sore throat and sinus problems.   Hematologic/Lymphatic:   Patient denies swollen glands and easy bruising.  Cardiovascular:   Patient denies leg swelling and chest pains.  Respiratory:   Patient denies cough and shortness of breath.  Endocrine:   Patient denies excessive thirst.  Musculoskeletal:   Patient denies back pain and joint pain.  Neurological:   Patient denies headaches and dizziness.  Psychologic:   Patient denies depression and anxiety.   VITAL SIGNS:      06/10/2018 08:57 AM  Weight 180 lb / 81.65 kg  Height 65 in / 165.1 cm  BP 128/81 mmHg  Heart Rate 81 /min  Temperature 98.1 F / 36.7 C  BMI 30.0 kg/m   MULTI-SYSTEM PHYSICAL EXAMINATION:    Constitutional: Well-nourished. No physical deformities. Normally developed. Good grooming.  Respiratory: No labored breathing, no use of accessory muscles.   Cardiovascular: Normal temperature, adequate perfusion of extremities  Skin: No paleness, no jaundice  Neurologic / Psychiatric: Oriented to time, oriented to place, oriented to person. No depression, no anxiety, no agitation.  Gastrointestinal: No mass, no tenderness, no rigidity, non obese abdomen.  Eyes: Normal conjunctivae. Normal eyelids.  Musculoskeletal: Normal gait and station of head and neck.     PAST DATA REVIEWED:  Source Of History:  Patient  Records Review:   Previous Patient Records  X-Ray Review: C.T. Abdomen/Pelvis: Reviewed Films. Reviewed Report. Discussed With Patient.     PROCEDURES:         KUB - [id number]  A single view of the abdomen is obtained.  Persistent right distal ureterovesicular junction 4 mm calculus read demonstrated  Urinalysis Dipstick Dipstick Cont'd  Color: Yellow Bilirubin: Neg mg/dL  Appearance: Clear Ketones: Neg mg/dL  Specific Gravity: <=4.259 Blood: Neg ery/uL  pH: 6.0 Protein: Neg mg/dL  Glucose: Neg mg/dL Urobilinogen: 0.2 mg/dL    Nitrites: Neg    Leukocyte Esterase: Neg leu/uL    ASSESSMENT:      ICD-10 Details  1 GU:    Ureteral calculus - N20.1   2   Ureteral obstruction secondary to calculous - N13.2    PLAN:           Orders X-Rays: KUB          Schedule         Document Letter(s):  Created for Patient: Clinical Summary         Notes:   We discussed the management of urinary stones. These options include observation, ureteroscopy, and shockwave lithotripsy. We discussed which options are relevant to these particular stones. We discussed the natural history of stones as well as the complications of untreated stones and the impact on quality of life without treatment as well as with each of the above listed treatments. We also discussed the efficacy of each treatment in its ability to clear the stone burden. With any of these management options I discussed the signs and symptoms of infection and the need for emergent treatment should these be experienced. For each option we discussed the ability of each procedure to clear the patient of their stone burden.   For observation I described the risks which include but are not limited to silent renal damage, life-threatening infection, need for emergent surgery, failure to pass stone, and pain.   For ureteroscopy I described the risks which include heart attack, stroke, pulmonary embolus, death, bleeding, infection, damage to contiguous structures, positioning injury, ureteral stricture, ureteral avulsion, ureteral injury, need for ureteral stent, inability to perform ureteroscopy, need for an interval procedure, inability to clear stone burden, stent discomfort and pain.   For shockwave lithotripsy I described the risks which include arrhythmia, kidney contusion, kidney hemorrhage, need for transfusion, pain, inability to break up stone, inability to pass stone fragments, Steinstrasse, infection associated with obstructing stones, need for different surgical procedure, need for repeat shockwave lithotripsy.   She would like to proceed with right ESWL. I gave her  a strainer. She will continue Flomax and not be present as needed. If she passes the stone in the meantime, she will call us.

## 2018-06-19 NOTE — Progress Notes (Signed)
Procedure cancelled, no sedation received.  Pt discharged with follow-up appointment with Dr Alvester Morin

## 2018-06-19 NOTE — Op Note (Signed)
Stone was not identified on the KUB.  We administered IV contrast and there was a strong ureteral jet into the bladder under Fluoro, with no appreciable hydronephrosis or filling defect.    Case was terminated - she did not get any additional IV sedation and no shocks.

## 2018-06-19 NOTE — Discharge Instructions (Signed)
See [facility] discharge instructions in chart.  

## 2018-06-20 ENCOUNTER — Encounter (HOSPITAL_COMMUNITY): Payer: Self-pay | Admitting: Urology

## 2018-06-27 DIAGNOSIS — N201 Calculus of ureter: Secondary | ICD-10-CM | POA: Diagnosis not present

## 2018-07-02 DIAGNOSIS — Z0000 Encounter for general adult medical examination without abnormal findings: Secondary | ICD-10-CM | POA: Diagnosis not present

## 2018-07-02 DIAGNOSIS — E7800 Pure hypercholesterolemia, unspecified: Secondary | ICD-10-CM | POA: Diagnosis not present

## 2018-07-17 ENCOUNTER — Other Ambulatory Visit: Payer: Self-pay | Admitting: Family Medicine

## 2018-07-17 DIAGNOSIS — Z1231 Encounter for screening mammogram for malignant neoplasm of breast: Secondary | ICD-10-CM

## 2018-09-01 ENCOUNTER — Other Ambulatory Visit: Payer: Self-pay

## 2018-09-01 ENCOUNTER — Ambulatory Visit
Admission: RE | Admit: 2018-09-01 | Discharge: 2018-09-01 | Disposition: A | Discharge status: Home / Self Care | Payer: BLUE CROSS/BLUE SHIELD | Source: Ambulatory Visit | Attending: Family Medicine | Admitting: Family Medicine

## 2018-09-01 DIAGNOSIS — Z1231 Encounter for screening mammogram for malignant neoplasm of breast: Secondary | ICD-10-CM

## 2018-12-09 DIAGNOSIS — Z6829 Body mass index (BMI) 29.0-29.9, adult: Secondary | ICD-10-CM | POA: Diagnosis not present

## 2018-12-09 DIAGNOSIS — Z01419 Encounter for gynecological examination (general) (routine) without abnormal findings: Secondary | ICD-10-CM | POA: Diagnosis not present

## 2018-12-31 DIAGNOSIS — E7800 Pure hypercholesterolemia, unspecified: Secondary | ICD-10-CM | POA: Diagnosis not present

## 2019-02-01 IMAGING — MG DIGITAL SCREENING BILATERAL MAMMOGRAM WITH TOMO AND CAD
8 series · 9 of 24 positions shown · non-contrast
Comparison: Previous exam(s).

CLINICAL DATA: Screening.

EXAM:
DIGITAL SCREENING BILATERAL MAMMOGRAM WITH TOMO AND CAD

[R MLO synth-2D]
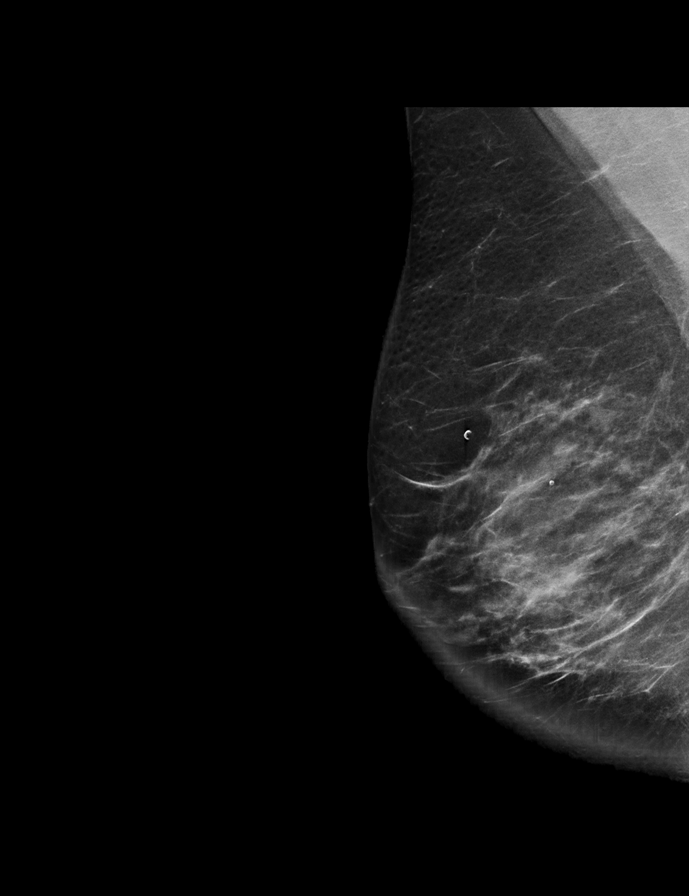

[L CC synth-2D]
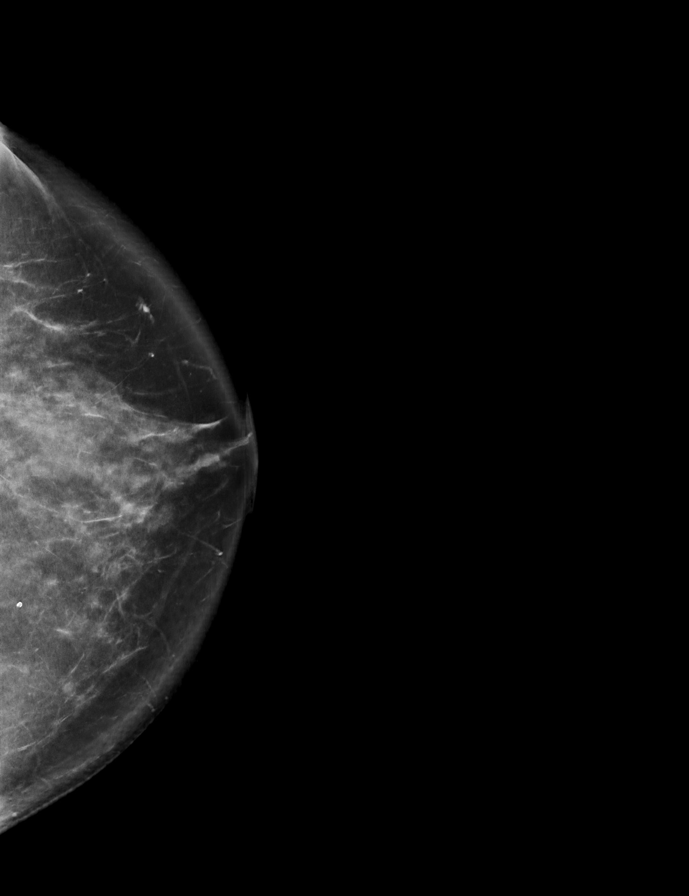

[R CC synth-2D]
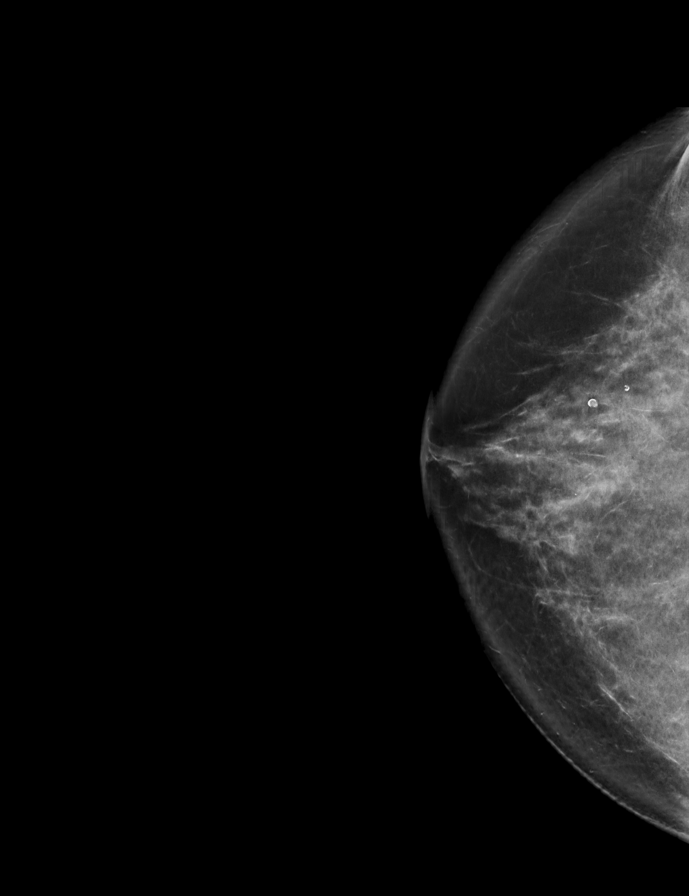

[L MLO synth-2D]
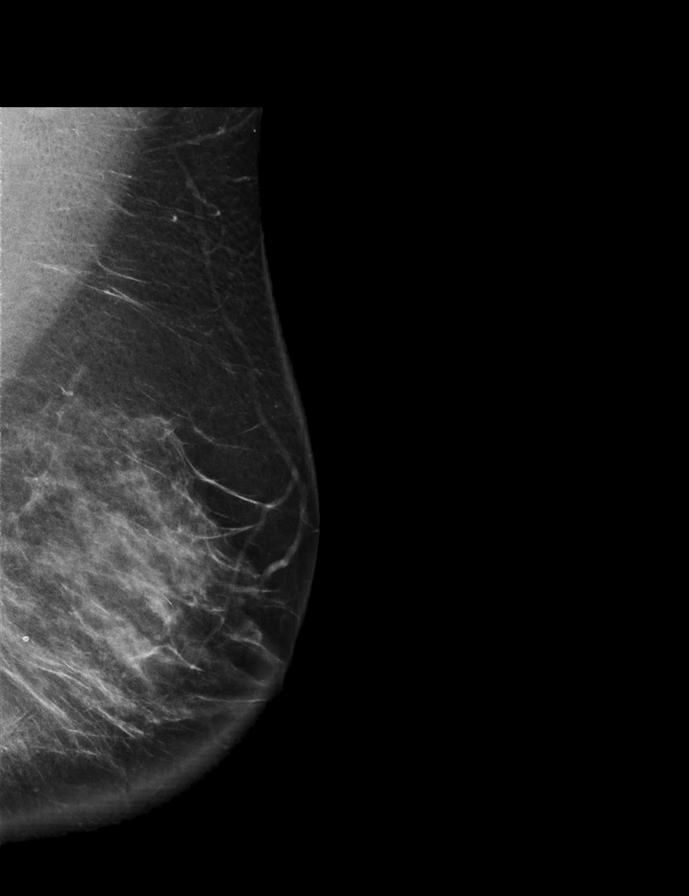

[R CC tomo · 2 of 92 frames shown]
[frame 30/92]
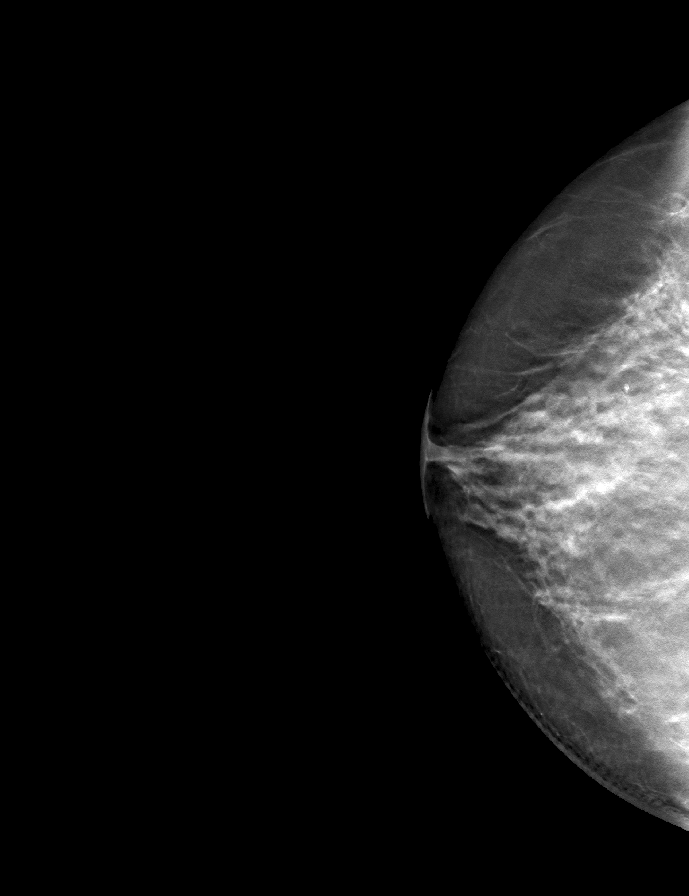
[frame 47/92]
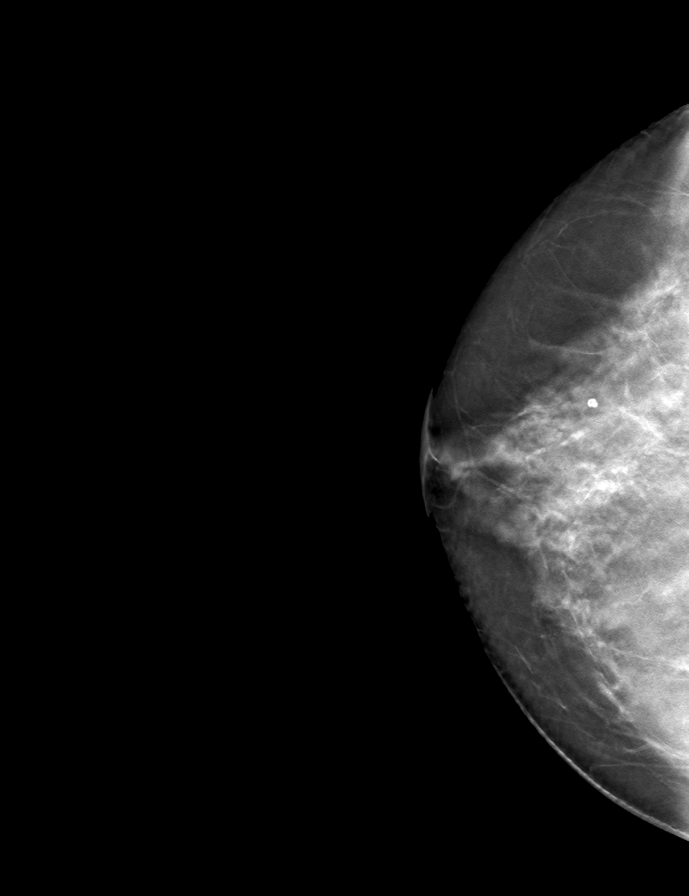

[L CC tomo · tomo slice 47/94.0]
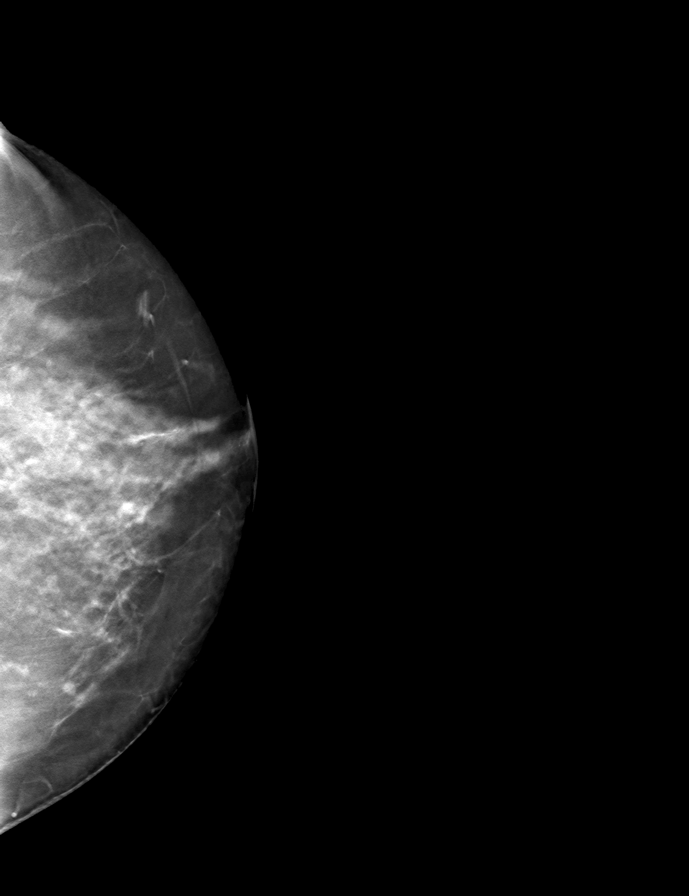

[R MLO tomo · tomo slice 49/96.0]
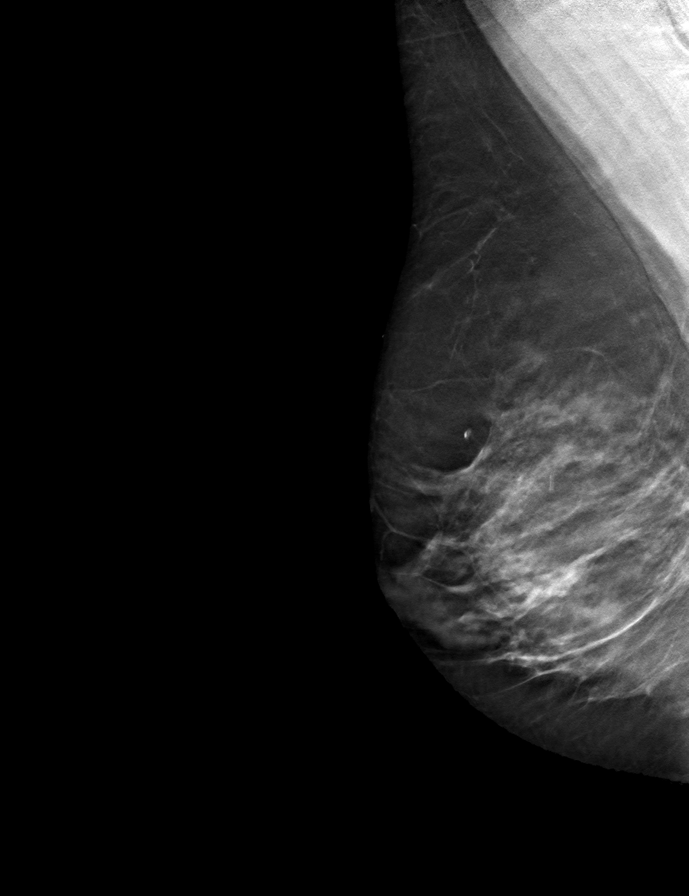

[L MLO tomo · tomo slice 51/102.0]
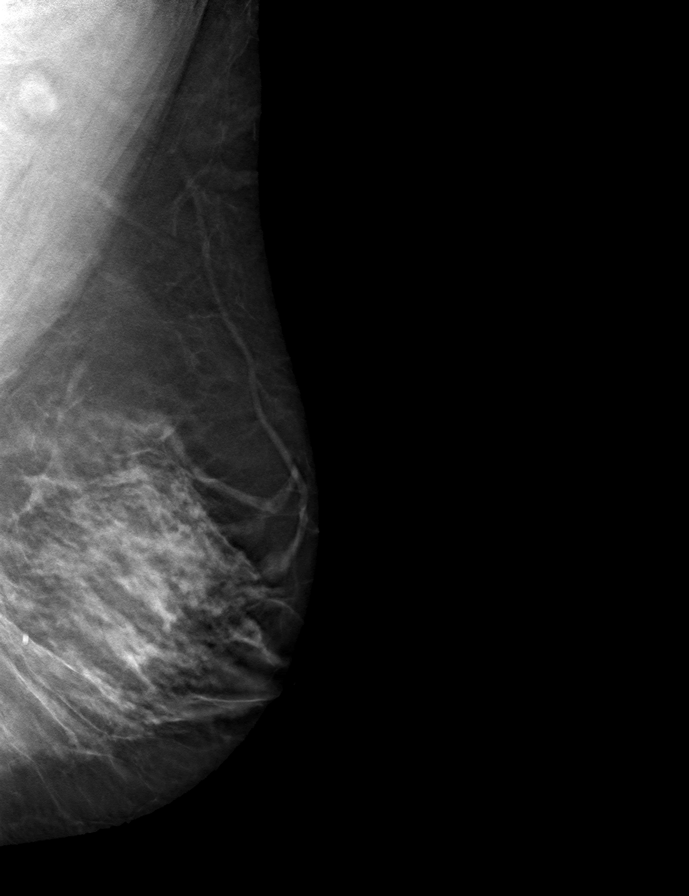

[9 of 24 positions shown; findings below may reference images not displayed]

ACR Breast Density Category c: The breast tissue is heterogeneously
dense, which may obscure small masses.
FINDINGS: There are no findings suspicious for malignancy. Images were
processed with CAD.
IMPRESSION: No mammographic evidence of malignancy. A result letter of this
screening mammogram will be mailed directly to the patient.

RECOMMENDATION:
Screening mammogram in one year. (Code:[id number]-[id number])

BI-RADS CATEGORY  1: Negative.

## 2019-07-06 DIAGNOSIS — Z0000 Encounter for general adult medical examination without abnormal findings: Secondary | ICD-10-CM | POA: Diagnosis not present

## 2019-07-08 ENCOUNTER — Other Ambulatory Visit: Payer: Self-pay | Admitting: Family Medicine

## 2019-07-08 DIAGNOSIS — Z1231 Encounter for screening mammogram for malignant neoplasm of breast: Secondary | ICD-10-CM

## 2019-07-10 DIAGNOSIS — E7800 Pure hypercholesterolemia, unspecified: Secondary | ICD-10-CM | POA: Diagnosis not present

## 2019-07-14 DIAGNOSIS — H25013 Cortical age-related cataract, bilateral: Secondary | ICD-10-CM | POA: Diagnosis not present

## 2019-07-14 DIAGNOSIS — H5213 Myopia, bilateral: Secondary | ICD-10-CM | POA: Diagnosis not present

## 2019-09-09 ENCOUNTER — Other Ambulatory Visit: Payer: Self-pay

## 2019-09-09 ENCOUNTER — Ambulatory Visit
Admission: RE | Admit: 2019-09-09 | Discharge: 2019-09-09 | Disposition: A | Discharge status: Home / Self Care | Payer: BLUE CROSS/BLUE SHIELD | Source: Ambulatory Visit | Attending: Family Medicine | Admitting: Family Medicine

## 2019-09-09 DIAGNOSIS — Z1231 Encounter for screening mammogram for malignant neoplasm of breast: Secondary | ICD-10-CM

## 2019-12-15 DIAGNOSIS — Z1151 Encounter for screening for human papillomavirus (HPV): Secondary | ICD-10-CM | POA: Diagnosis not present

## 2019-12-15 DIAGNOSIS — Z6829 Body mass index (BMI) 29.0-29.9, adult: Secondary | ICD-10-CM | POA: Diagnosis not present

## 2019-12-15 DIAGNOSIS — Z01419 Encounter for gynecological examination (general) (routine) without abnormal findings: Secondary | ICD-10-CM | POA: Diagnosis not present

## 2020-01-04 DIAGNOSIS — E7800 Pure hypercholesterolemia, unspecified: Secondary | ICD-10-CM | POA: Diagnosis not present

## 2020-07-08 DIAGNOSIS — E7800 Pure hypercholesterolemia, unspecified: Secondary | ICD-10-CM | POA: Diagnosis not present

## 2020-07-08 DIAGNOSIS — Z0000 Encounter for general adult medical examination without abnormal findings: Secondary | ICD-10-CM | POA: Diagnosis not present

## 2020-07-14 ENCOUNTER — Other Ambulatory Visit: Payer: Self-pay | Admitting: Family Medicine

## 2020-07-14 DIAGNOSIS — Z1231 Encounter for screening mammogram for malignant neoplasm of breast: Secondary | ICD-10-CM

## 2020-07-21 DIAGNOSIS — B0052 Herpesviral keratitis: Secondary | ICD-10-CM | POA: Diagnosis not present

## 2020-07-25 DIAGNOSIS — B0052 Herpesviral keratitis: Secondary | ICD-10-CM | POA: Diagnosis not present

## 2020-09-20 ENCOUNTER — Other Ambulatory Visit: Payer: Self-pay

## 2020-09-20 ENCOUNTER — Ambulatory Visit
Admission: RE | Admit: 2020-09-20 | Discharge: 2020-09-20 | Disposition: A | Discharge status: Home / Self Care | Payer: BC Managed Care – PPO | Source: Ambulatory Visit | Attending: Family Medicine | Admitting: Family Medicine

## 2020-09-20 DIAGNOSIS — Z1231 Encounter for screening mammogram for malignant neoplasm of breast: Secondary | ICD-10-CM | POA: Diagnosis not present

## 2020-11-11 ENCOUNTER — Encounter: Payer: Self-pay | Admitting: Cardiovascular Disease

## 2020-11-11 ENCOUNTER — Ambulatory Visit (INDEPENDENT_AMBULATORY_CARE_PROVIDER_SITE_OTHER): Payer: BC Managed Care – PPO | Admitting: Cardiovascular Disease

## 2020-11-11 ENCOUNTER — Other Ambulatory Visit: Payer: Self-pay

## 2020-11-11 VITALS — BP 118/78 | HR 74 | Ht 65.0 in | Wt 184.0 lb

## 2020-11-11 DIAGNOSIS — R079 Chest pain, unspecified: Secondary | ICD-10-CM | POA: Diagnosis not present

## 2020-11-11 NOTE — Progress Notes (Signed)
Cardiology Consultation Note:    Date:  11/11/2020   ID:  Sarah Schultz, DOB [birth year], MRN [id number]  PCP:  Maurice Small, MD   [facility] Providers Cardiologist:  None     Referring MD: Maurice Small, MD   Chief Complaint  Patient presents with  . New Patient (Initial Visit)  . Chest Pain  Sarah Schultz is a 65 y.o. female who is being seen today for the evaluation of chest pain at the request of Maurice Small, MD.   History of Present Illness:    Sarah Schultz is a 65 y.o. female with a hx of hypercholesterolemia and palpitations who presents in consultation after a couple of episodes of chest pain.  EXTR Sunday she was sitting on the couch when she experienced sudden severe left-sided chest pain radiating towards her shoulder.  She was fully at rest at the time.  The symptoms subsided spontaneously after about 10 minutes.  She did not have any associated dyspnea, diaphoresis, nausea or other complaints.  There were no palpitations at the time.  5 days later she was woken from sleep by similar sharp left-sided chest pain, it although it was less intense and lasted a shorter time.  The chest discomfort has not recurred in the ensuing 6 weeks.  She is very physically active.  She gardens every morning from 6 to 8 AM and has a 4 acre garden.  She walks her dog daily in the [place] about 1-1/2 miles.  She never experienced chest pain during exercise.  For many years she has had palpitations which she describes as an occasional "thumping" in her chest, often triggered by caffeine, prevented by taking atenolol when she expects that she will occur.  ECG from 2017 shows frequent PACs in a pattern of trigeminy with a very distinct inverted P wave.  She has a longstanding history of hypercholesterolemia and has been taking atorvastatin, but does not have any history of CAD or PAD, stroke or known atherosclerosis.  She has never smoked but both her parents were heavy  smokers.  Her father died of lung cancer and had hypertension.  Her mother died of stroke in her 82s and also had hypertension.  There is no family history of sudden cardiac death, myocardial infarction, bypass surgery/stents, PAD or other atherosclerotic disease except possibly for her mother's stroke.   Past Medical History:  Diagnosis Date  . History of kidney stones   . Miscarriage   . PVC (premature ventricular contraction)     Past Surgical History:  Procedure Laterality Date  . DILATION AND CURETTAGE OF UTERUS    . EXTRACORPOREAL SHOCK WAVE LITHOTRIPSY Right 06/19/2018   Procedure: EXTRACORPOREAL SHOCK WAVE LITHOTRIPSY (ESWL);  Surgeon: Crist Fat, MD;  Location: [facility];  Service: Urology;  Laterality: Right;  . left knee arthroscopy    . MYOMECTOMY    . right knee arthroscopy    . WISDOM TOOTH EXTRACTION      Current Medications: Current Meds  Medication Sig  . aspirin EC 81 MG tablet Take 81 mg by mouth every other day. Sun, Mon, Wed, Fri  . atenolol (TENORMIN) 25 MG tablet Take 25 mg by mouth as needed.  Marland Kitchen atorvastatin (LIPITOR) 20 MG tablet Take 20 mg by mouth daily.  Marland Kitchen estradiol (ESTRACE) 1 MG tablet Take 1 mg by mouth daily.  Marland Kitchen loratadine (CLARITIN) 10 MG tablet Take 10 mg by mouth daily.  . norethindrone (AYGESTIN) 5 MG tablet Take 2.5  mg by mouth every other day. Sun, Mon, Wed, Fri  . [DISCONTINUED] ondansetron (ZOFRAN ODT) 4 MG disintegrating tablet Take 1 tablet (4 mg total) by mouth every 8 (eight) hours as needed for nausea or vomiting.  . [DISCONTINUED] tamsulosin (FLOMAX) 0.4 MG CAPS capsule Take 1 capsule (0.4 mg total) by mouth daily.     Allergies:   Patient has no known allergies.   Social History   Socioeconomic History  . Marital status: Married    Spouse name: Not on file  . Number of children: Not on file  . Years of education: Not on file  . Highest education level: Not on file  Occupational History  . Not on file  Tobacco Use  .  Smoking status: Never Smoker  . Smokeless tobacco: Never Used  Vaping Use  . Vaping Use: Never used  Substance and Sexual Activity  . Alcohol use: Yes  . Drug use: Never  . Sexual activity: Not on file  Other Topics Concern  . Not on file  Social History Narrative  . Not on file   Social Determinants of Health   Financial Resource Strain: Not on file  Food Insecurity: Not on file  Transportation Needs: Not on file  Physical Activity: Not on file  Stress: Not on file  Social Connections: Not on file     Family History: The patient's family history includes Breast cancer (age of onset: 48) in her cousin. Stroke in her mother, lung cancer in her father. ROS:   Please see the history of present illness.     All other systems reviewed and are negative.  EKGs/Labs/Other Studies Reviewed:    The following studies were reviewed today: Notes from Dr. Valentina Lucks, including ECG from 2017 that shows sinus rhythm with frequent PACs in a pattern of trigeminy and a distinct inverted P waves, otherwise normal tracing  CT scan abdomen and pelvis 06/03/2018  EKG:  EKG is ordered today.  The ekg ordered today demonstrates normal sinus rhythm, borderline low voltage QRS, otherwise normal  Recent Labs: No results found for requested labs within last 8760 hours.  Recent Lipid Panel No results found for: CHOL, TRIG, HDL, CHOLHDL, VLDL, LDLCALC, LDLDIRECT  07/08/2020 Cholesterol 208, HDL 54, LDL 125, triglycerides 165 (on statin) Creatinine 0.69, potassium 4.3, normal liver function tests Risk Assessment/Calculations:       Physical Exam:    VS:  BP 118/78 (BP Location: Left Arm, Patient Position: Sitting, Cuff Size: Normal)   Pulse 74   Ht 5\' 5"  (1.651 m)   Wt 184 lb (83.5 kg)   BMI 30.62 kg/m     Wt Readings from Last 3 Encounters:  11/11/20 184 lb (83.5 kg)  06/19/18 181 lb 7 oz (82.3 kg)  06/03/18 181 lb 7 oz (82.3 kg)     GEN: Borderline obese, but appears fit, well  nourished, well developed in no acute distress HEENT: Normal NECK: No JVD; No carotid bruits LYMPHATICS: No lymphadenopathy CARDIAC: RRR, no murmurs, rubs, gallops RESPIRATORY:  Clear to auscultation without rales, wheezing or rhonchi  ABDOMEN: Soft, non-tender, non-distended MUSCULOSKELETAL:  No edema; No deformity  SKIN: Warm and dry NEUROLOGIC:  Alert and oriented x 3 PSYCHIATRIC:  Normal affect   ASSESSMENT:    1. Chest pain of uncertain etiology    PLAN:    In order of problems listed above:  1. Chest pain: Symptoms are not typical for CAD, but she does have significant hypercholesterolemia and is taking hormone replacement therapy.  Recommend a treadmill stress test and an echocardiogram. 2. PACs: Echocardiogram will allow [place] to see atrial sizes and look for any other evidence of structural causes of arrhythmia.  If none is identified, would simply continue "as needed" low-dose beta-blocker. 3. Hypercholesterolemia: On moderate dose statin with LDL 125.  If any evidence of coronary disease or other evidence of atherosclerosis is identified, would recommend intensifying therapy to at least reach LDL less than 100.  Atherosclerotic changes on her CT of the abdomen and pelvis in 06/03/2018.   Shared Decision Making/Informed Consent The risks [chest pain, shortness of breath, cardiac arrhythmias, dizziness, blood pressure fluctuations, myocardial infarction, stroke/transient ischemic attack, and life-threatening complications (estimated to be 1 in 10,000)], benefits (risk stratification, diagnosing coronary artery disease, treatment guidance) and alternatives of an exercise tolerance test were discussed in detail with Sarah Schultz and she agrees to proceed.      Medication Adjustments/Labs and Tests Ordered: Current medicines are reviewed at length with the patient today.  Concerns regarding medicines are outlined above.  Orders Placed This Encounter  Procedures  . Exercise Tolerance  Test  . EKG 12-Lead  . ECHOCARDIOGRAM COMPLETE   No orders of the defined types were placed in this encounter.   Patient Instructions  Medication Instructions:  No changes *If you need a refill on your cardiac medications before your next appointment, please call your pharmacy*   Lab Work: None ordered If you have labs (blood work) drawn today and your tests are completely normal, you will receive your results only by: Marland Kitchen MyChart Message (if you have MyChart) OR . A paper copy in the mail If you have any lab test that is abnormal or we need to change your treatment, we will call you to review the results.   Testing/Procedures: Your physician has requested that you have an echocardiogram. Echocardiography is a painless test that uses sound waves to create images of your heart. It provides your doctor with information about the size and shape of your heart and how well your heart's chambers and valves are working. You may receive an ultrasound enhancing agent through an IV if needed to better visualize your heart during the echo.This procedure takes approximately one hour. There are no restrictions for this procedure. This will take place at the [address].   Your physician has requested that you have an exercise tolerance test. For further information please visit [email or web address] Please also follow instruction sheet, as given. This will take place at [address] [facility], [address].  Do not drink or eat foods with caffeine for 24 hours before the test. (Chocolate, coffee, tea, or energy drinks)  If you use an inhaler, bring it with you to the test.  Do not smoke for 4 hours before the test.  Wear comfortable shoes and clothing.  Don't take the as needed Atenolol the morning of the test    Follow-Up: At [facility], you and your health needs are our priority.  As part of our continuing mission to provide you with exceptional heart care, we have created  designated Provider Care Teams.  These Care Teams include your primary Cardiologist (physician) and Advanced Practice Providers (APPs -  Physician Assistants and Nurse Practitioners) who all work together to provide you with the care you need, when you need it.  We recommend signing up for the patient portal called "MyChart".  Sign up information is provided on this After Visit Summary.  MyChart is used to connect with  patients for Virtual Visits (Telemedicine).  Patients are able to view lab/test results, encounter notes, upcoming appointments, etc.  Non-urgent messages can be sent to your provider as well.   To learn more about what you can do with MyChart, go to [email or web address].    Your next appointment:   Follow up as needed with Dr. Royann Shiversroitoru      Signed, Thurmon FairMihai Saxton Chain, MD  11/11/2020 9:10 AM    [facility]

## 2020-11-11 NOTE — Patient Instructions (Signed)
Medication Instructions:  No changes *If you need a refill on your cardiac medications before your next appointment, please call your pharmacy*   Lab Work: None ordered If you have labs (blood work) drawn today and your tests are completely normal, you will receive your results only by: Marland Kitchen MyChart Message (if you have MyChart) OR . A paper copy in the mail If you have any lab test that is abnormal or we need to change your treatment, we will call you to review the results.   Testing/Procedures: Your physician has requested that you have an echocardiogram. Echocardiography is a painless test that uses sound waves to create images of your heart. It provides your doctor with information about the size and shape of your heart and how well your heart's chambers and valves are working. You may receive an ultrasound enhancing agent through an IV if needed to better visualize your heart during the echo.This procedure takes approximately one hour. There are no restrictions for this procedure. This will take place at the [address], [address].   Your physician has requested that you have an exercise tolerance test. For further information please visit [email or web address] Please also follow instruction sheet, as given. This will take place at [address] [facility], [address].  Do not drink or eat foods with caffeine for 24 hours before the test. (Chocolate, coffee, tea, or energy drinks)  If you use an inhaler, bring it with you to the test.  Do not smoke for 4 hours before the test.  Wear comfortable shoes and clothing.  Don't take the as needed Atenolol the morning of the test    Follow-Up: At [facility], you and your health needs are our priority.  As part of our continuing mission to provide you with exceptional heart care, we have created designated Provider Care Teams.  These Care Teams include your primary Cardiologist (physician) and Advanced Practice Providers (APPs -  Physician  Assistants and Nurse Practitioners) who all work together to provide you with the care you need, when you need it.  We recommend signing up for the patient portal called "MyChart".  Sign up information is provided on this After Visit Summary.  MyChart is used to connect with patients for Virtual Visits (Telemedicine).  Patients are able to view lab/test results, encounter notes, upcoming appointments, etc.  Non-urgent messages can be sent to your provider as well.   To learn more about what you can do with MyChart, go to [email or web address].    Your next appointment:   Follow up as needed with Dr. Royann Shivers

## 2020-12-09 ENCOUNTER — Telehealth (HOSPITAL_COMMUNITY): Payer: Self-pay | Admitting: *Deleted

## 2020-12-09 NOTE — Telephone Encounter (Signed)
Close encounter 

## 2020-12-13 ENCOUNTER — Ambulatory Visit (HOSPITAL_COMMUNITY)
Admission: RE | Admit: 2020-12-13 | Discharge: 2020-12-13 | Disposition: A | Discharge status: Home / Self Care | Payer: BC Managed Care – PPO | Source: Ambulatory Visit | Attending: Internal Medicine | Admitting: Internal Medicine

## 2020-12-13 ENCOUNTER — Other Ambulatory Visit: Payer: Self-pay

## 2020-12-13 ENCOUNTER — Ambulatory Visit (HOSPITAL_BASED_OUTPATIENT_CLINIC_OR_DEPARTMENT_OTHER): Payer: BC Managed Care – PPO

## 2020-12-13 DIAGNOSIS — R079 Chest pain, unspecified: Secondary | ICD-10-CM

## 2020-12-13 LAB — ECHOCARDIOGRAM COMPLETE
Area-P 1/2: 3.85 cm2
S' Lateral: 2.9 cm

## 2020-12-13 LAB — EXERCISE TOLERANCE TEST
Estimated workload: 10.4 METS
Exercise duration (min): 9 min
Exercise duration (sec): 0 s
MPHR: 157 {beats}/min
Peak HR: 160 {beats}/min
Percent HR: 101 %
Rest HR: 78 {beats}/min

## 2020-12-15 DIAGNOSIS — Z01419 Encounter for gynecological examination (general) (routine) without abnormal findings: Secondary | ICD-10-CM | POA: Diagnosis not present

## 2020-12-15 DIAGNOSIS — Z6825 Body mass index (BMI) 25.0-25.9, adult: Secondary | ICD-10-CM | POA: Diagnosis not present

## 2021-01-03 DIAGNOSIS — M7061 Trochanteric bursitis, right hip: Secondary | ICD-10-CM | POA: Diagnosis not present

## 2021-01-22 DIAGNOSIS — M25551 Pain in right hip: Secondary | ICD-10-CM | POA: Diagnosis not present

## 2021-02-13 DIAGNOSIS — M1611 Unilateral primary osteoarthritis, right hip: Secondary | ICD-10-CM | POA: Diagnosis not present

## 2021-03-08 DIAGNOSIS — M25551 Pain in right hip: Secondary | ICD-10-CM | POA: Diagnosis not present

## 2021-03-24 DIAGNOSIS — M1611 Unilateral primary osteoarthritis, right hip: Secondary | ICD-10-CM | POA: Diagnosis not present

## 2021-06-19 DIAGNOSIS — R059 Cough, unspecified: Secondary | ICD-10-CM | POA: Diagnosis not present

## 2021-06-19 DIAGNOSIS — R6883 Chills (without fever): Secondary | ICD-10-CM | POA: Diagnosis not present

## 2021-06-19 DIAGNOSIS — U071 COVID-19: Secondary | ICD-10-CM | POA: Diagnosis not present

## 2021-06-22 DIAGNOSIS — U071 COVID-19: Secondary | ICD-10-CM | POA: Diagnosis not present

## 2021-07-13 ENCOUNTER — Other Ambulatory Visit: Payer: Self-pay | Admitting: Family Medicine

## 2021-07-13 DIAGNOSIS — Z1231 Encounter for screening mammogram for malignant neoplasm of breast: Secondary | ICD-10-CM

## 2021-07-13 DIAGNOSIS — Z13820 Encounter for screening for osteoporosis: Secondary | ICD-10-CM

## 2021-07-13 DIAGNOSIS — E7800 Pure hypercholesterolemia, unspecified: Secondary | ICD-10-CM | POA: Diagnosis not present

## 2021-07-13 DIAGNOSIS — Z23 Encounter for immunization: Secondary | ICD-10-CM | POA: Diagnosis not present

## 2021-07-13 DIAGNOSIS — Z0000 Encounter for general adult medical examination without abnormal findings: Secondary | ICD-10-CM | POA: Diagnosis not present

## 2021-07-13 DIAGNOSIS — N951 Menopausal and female climacteric states: Secondary | ICD-10-CM | POA: Diagnosis not present

## 2021-07-13 DIAGNOSIS — I493 Ventricular premature depolarization: Secondary | ICD-10-CM | POA: Diagnosis not present

## 2021-07-14 DIAGNOSIS — H2513 Age-related nuclear cataract, bilateral: Secondary | ICD-10-CM | POA: Diagnosis not present

## 2021-07-14 DIAGNOSIS — H5213 Myopia, bilateral: Secondary | ICD-10-CM | POA: Diagnosis not present

## 2021-08-09 ENCOUNTER — Ambulatory Visit
Admission: RE | Admit: 2021-08-09 | Discharge: 2021-08-09 | Disposition: A | Discharge status: Home / Self Care | Payer: BC Managed Care – PPO | Source: Ambulatory Visit | Attending: Family Medicine | Admitting: Family Medicine

## 2021-08-09 DIAGNOSIS — Z780 Asymptomatic menopausal state: Secondary | ICD-10-CM | POA: Diagnosis not present

## 2021-08-09 DIAGNOSIS — Z13820 Encounter for screening for osteoporosis: Secondary | ICD-10-CM

## 2021-09-15 DIAGNOSIS — E039 Hypothyroidism, unspecified: Secondary | ICD-10-CM | POA: Diagnosis not present

## 2021-09-21 ENCOUNTER — Ambulatory Visit
Admission: RE | Admit: 2021-09-21 | Discharge: 2021-09-21 | Disposition: A | Discharge status: Home / Self Care | Payer: BC Managed Care – PPO | Source: Ambulatory Visit | Attending: Family Medicine | Admitting: Family Medicine

## 2021-09-21 DIAGNOSIS — Z1231 Encounter for screening mammogram for malignant neoplasm of breast: Secondary | ICD-10-CM

## 2021-11-08 DIAGNOSIS — M25551 Pain in right hip: Secondary | ICD-10-CM | POA: Diagnosis not present

## 2021-11-24 DIAGNOSIS — E039 Hypothyroidism, unspecified: Secondary | ICD-10-CM | POA: Diagnosis not present

## 2021-12-20 DIAGNOSIS — Z01419 Encounter for gynecological examination (general) (routine) without abnormal findings: Secondary | ICD-10-CM | POA: Diagnosis not present

## 2021-12-20 DIAGNOSIS — Z6825 Body mass index (BMI) 25.0-25.9, adult: Secondary | ICD-10-CM | POA: Diagnosis not present

## 2022-01-15 DIAGNOSIS — E7800 Pure hypercholesterolemia, unspecified: Secondary | ICD-10-CM | POA: Diagnosis not present

## 2022-02-22 DIAGNOSIS — I499 Cardiac arrhythmia, unspecified: Secondary | ICD-10-CM | POA: Diagnosis not present

## 2022-02-22 DIAGNOSIS — R42 Dizziness and giddiness: Secondary | ICD-10-CM | POA: Diagnosis not present

## 2022-03-28 DIAGNOSIS — M25551 Pain in right hip: Secondary | ICD-10-CM | POA: Diagnosis not present

## 2022-07-16 DIAGNOSIS — Z0000 Encounter for general adult medical examination without abnormal findings: Secondary | ICD-10-CM | POA: Diagnosis not present

## 2022-07-16 DIAGNOSIS — E039 Hypothyroidism, unspecified: Secondary | ICD-10-CM | POA: Diagnosis not present

## 2022-07-16 DIAGNOSIS — Z23 Encounter for immunization: Secondary | ICD-10-CM | POA: Diagnosis not present

## 2022-07-16 DIAGNOSIS — E7800 Pure hypercholesterolemia, unspecified: Secondary | ICD-10-CM | POA: Diagnosis not present

## 2022-07-16 DIAGNOSIS — I493 Ventricular premature depolarization: Secondary | ICD-10-CM | POA: Diagnosis not present

## 2022-07-18 DIAGNOSIS — H2513 Age-related nuclear cataract, bilateral: Secondary | ICD-10-CM | POA: Diagnosis not present

## 2022-07-18 DIAGNOSIS — H5213 Myopia, bilateral: Secondary | ICD-10-CM | POA: Diagnosis not present

## 2022-08-02 DIAGNOSIS — E039 Hypothyroidism, unspecified: Secondary | ICD-10-CM | POA: Diagnosis not present

## 2022-08-02 DIAGNOSIS — E782 Mixed hyperlipidemia: Secondary | ICD-10-CM | POA: Diagnosis not present

## 2022-08-02 DIAGNOSIS — E669 Obesity, unspecified: Secondary | ICD-10-CM | POA: Diagnosis not present

## 2022-08-02 DIAGNOSIS — Z1211 Encounter for screening for malignant neoplasm of colon: Secondary | ICD-10-CM | POA: Diagnosis not present

## 2022-08-02 DIAGNOSIS — Z86010 Personal history of colonic polyps: Secondary | ICD-10-CM | POA: Diagnosis not present

## 2022-08-09 ENCOUNTER — Other Ambulatory Visit: Payer: Self-pay | Admitting: Family Medicine

## 2022-08-09 DIAGNOSIS — Z1231 Encounter for screening mammogram for malignant neoplasm of breast: Secondary | ICD-10-CM

## 2022-09-25 ENCOUNTER — Ambulatory Visit: Payer: BC Managed Care – PPO

## 2022-10-03 DIAGNOSIS — Z79890 Hormone replacement therapy (postmenopausal): Secondary | ICD-10-CM | POA: Diagnosis not present

## 2022-10-03 DIAGNOSIS — Z124 Encounter for screening for malignant neoplasm of cervix: Secondary | ICD-10-CM | POA: Diagnosis not present

## 2022-10-03 DIAGNOSIS — Z6830 Body mass index (BMI) 30.0-30.9, adult: Secondary | ICD-10-CM | POA: Diagnosis not present

## 2022-10-03 DIAGNOSIS — Z01411 Encounter for gynecological examination (general) (routine) with abnormal findings: Secondary | ICD-10-CM | POA: Diagnosis not present

## 2022-10-03 DIAGNOSIS — N950 Postmenopausal bleeding: Secondary | ICD-10-CM | POA: Diagnosis not present

## 2022-10-03 DIAGNOSIS — Z01419 Encounter for gynecological examination (general) (routine) without abnormal findings: Secondary | ICD-10-CM | POA: Diagnosis not present

## 2022-10-08 DIAGNOSIS — Z1211 Encounter for screening for malignant neoplasm of colon: Secondary | ICD-10-CM | POA: Diagnosis not present

## 2022-10-08 DIAGNOSIS — I10 Essential (primary) hypertension: Secondary | ICD-10-CM | POA: Diagnosis not present

## 2022-10-08 DIAGNOSIS — E782 Mixed hyperlipidemia: Secondary | ICD-10-CM | POA: Diagnosis not present

## 2022-10-08 DIAGNOSIS — I2585 Chronic coronary microvascular dysfunction: Secondary | ICD-10-CM | POA: Diagnosis not present

## 2022-10-08 DIAGNOSIS — I25119 Atherosclerotic heart disease of native coronary artery with unspecified angina pectoris: Secondary | ICD-10-CM | POA: Diagnosis not present

## 2022-10-08 DIAGNOSIS — I340 Nonrheumatic mitral (valve) insufficiency: Secondary | ICD-10-CM | POA: Diagnosis not present

## 2022-10-08 DIAGNOSIS — Z86010 Personal history of colonic polyps: Secondary | ICD-10-CM | POA: Diagnosis not present

## 2022-10-08 DIAGNOSIS — I351 Nonrheumatic aortic (valve) insufficiency: Secondary | ICD-10-CM | POA: Diagnosis not present

## 2022-10-10 DIAGNOSIS — M25551 Pain in right hip: Secondary | ICD-10-CM | POA: Diagnosis not present

## 2022-10-29 ENCOUNTER — Ambulatory Visit
Admission: RE | Admit: 2022-10-29 | Discharge: 2022-10-29 | Disposition: A | Discharge status: Home / Self Care | Payer: PPO | Source: Ambulatory Visit | Attending: Family Medicine | Admitting: Family Medicine

## 2022-10-29 DIAGNOSIS — Z1231 Encounter for screening mammogram for malignant neoplasm of breast: Secondary | ICD-10-CM | POA: Diagnosis not present

## 2023-01-14 DIAGNOSIS — E7800 Pure hypercholesterolemia, unspecified: Secondary | ICD-10-CM | POA: Diagnosis not present

## 2023-01-14 DIAGNOSIS — I493 Ventricular premature depolarization: Secondary | ICD-10-CM | POA: Diagnosis not present

## 2023-01-14 DIAGNOSIS — E039 Hypothyroidism, unspecified: Secondary | ICD-10-CM | POA: Diagnosis not present

## 2023-02-27 DIAGNOSIS — S91209A Unspecified open wound of unspecified toe(s) with damage to nail, initial encounter: Secondary | ICD-10-CM | POA: Diagnosis not present

## 2023-02-27 DIAGNOSIS — M79672 Pain in left foot: Secondary | ICD-10-CM | POA: Diagnosis not present

## 2023-09-04 ENCOUNTER — Other Ambulatory Visit: Payer: Self-pay | Admitting: Obstetrics and Gynecology

## 2023-09-04 DIAGNOSIS — Z1231 Encounter for screening mammogram for malignant neoplasm of breast: Secondary | ICD-10-CM

## 2023-10-03 DIAGNOSIS — R0981 Nasal congestion: Secondary | ICD-10-CM | POA: Diagnosis not present

## 2023-10-03 DIAGNOSIS — Z6834 Body mass index (BMI) 34.0-34.9, adult: Secondary | ICD-10-CM | POA: Diagnosis not present

## 2023-10-03 DIAGNOSIS — J302 Other seasonal allergic rhinitis: Secondary | ICD-10-CM | POA: Diagnosis not present

## 2023-10-03 DIAGNOSIS — E6609 Other obesity due to excess calories: Secondary | ICD-10-CM | POA: Diagnosis not present

## 2023-10-07 DIAGNOSIS — Z01419 Encounter for gynecological examination (general) (routine) without abnormal findings: Secondary | ICD-10-CM | POA: Diagnosis not present

## 2023-10-07 DIAGNOSIS — Z1331 Encounter for screening for depression: Secondary | ICD-10-CM | POA: Diagnosis not present

## 2023-10-23 ENCOUNTER — Encounter (HOSPITAL_COMMUNITY): Payer: Self-pay

## 2023-10-30 ENCOUNTER — Ambulatory Visit
Admission: RE | Admit: 2023-10-30 | Discharge: 2023-10-30 | Disposition: A | Discharge status: Home / Self Care | Source: Ambulatory Visit | Attending: Obstetrics and Gynecology

## 2023-10-30 DIAGNOSIS — Z1231 Encounter for screening mammogram for malignant neoplasm of breast: Secondary | ICD-10-CM

## 2024-01-09 DIAGNOSIS — I493 Ventricular premature depolarization: Secondary | ICD-10-CM | POA: Diagnosis not present

## 2024-01-09 DIAGNOSIS — E7800 Pure hypercholesterolemia, unspecified: Secondary | ICD-10-CM | POA: Diagnosis not present

## 2024-01-09 DIAGNOSIS — E039 Hypothyroidism, unspecified: Secondary | ICD-10-CM | POA: Diagnosis not present

## 2024-01-13 DIAGNOSIS — N951 Menopausal and female climacteric states: Secondary | ICD-10-CM | POA: Diagnosis not present

## 2024-01-13 DIAGNOSIS — E039 Hypothyroidism, unspecified: Secondary | ICD-10-CM | POA: Diagnosis not present

## 2024-01-13 DIAGNOSIS — M25551 Pain in right hip: Secondary | ICD-10-CM | POA: Diagnosis not present

## 2024-01-13 DIAGNOSIS — I493 Ventricular premature depolarization: Secondary | ICD-10-CM | POA: Diagnosis not present

## 2024-01-13 DIAGNOSIS — E7800 Pure hypercholesterolemia, unspecified: Secondary | ICD-10-CM | POA: Diagnosis not present

## 2024-01-20 DIAGNOSIS — M1611 Unilateral primary osteoarthritis, right hip: Secondary | ICD-10-CM | POA: Diagnosis not present

## 2024-02-21 DIAGNOSIS — L239 Allergic contact dermatitis, unspecified cause: Secondary | ICD-10-CM | POA: Diagnosis not present

## 2024-02-21 DIAGNOSIS — E6609 Other obesity due to excess calories: Secondary | ICD-10-CM | POA: Diagnosis not present

## 2024-02-21 DIAGNOSIS — Z6834 Body mass index (BMI) 34.0-34.9, adult: Secondary | ICD-10-CM | POA: Diagnosis not present

## 2024-04-08 DIAGNOSIS — N950 Postmenopausal bleeding: Secondary | ICD-10-CM | POA: Diagnosis not present

## 2024-04-08 DIAGNOSIS — D251 Intramural leiomyoma of uterus: Secondary | ICD-10-CM | POA: Diagnosis not present

## 2024-04-08 DIAGNOSIS — N951 Menopausal and female climacteric states: Secondary | ICD-10-CM | POA: Diagnosis not present
